# Patient Record
Sex: Male | Born: 1955 | Race: White | Hispanic: No | Smoking: Never smoker
Health system: Southern US, Community
[De-identification: ages and names within clinical notes are randomized; demographics above are authoritative.]

## PROBLEM LIST (undated history)

## (undated) DIAGNOSIS — E785 Hyperlipidemia, unspecified: Secondary | ICD-10-CM

## (undated) DIAGNOSIS — M1990 Unspecified osteoarthritis, unspecified site: Secondary | ICD-10-CM

## (undated) DIAGNOSIS — E119 Type 2 diabetes mellitus without complications: Secondary | ICD-10-CM

## (undated) DIAGNOSIS — K219 Gastro-esophageal reflux disease without esophagitis: Secondary | ICD-10-CM

## (undated) DIAGNOSIS — C801 Malignant (primary) neoplasm, unspecified: Secondary | ICD-10-CM

## (undated) HISTORY — PX: NOSE SURGERY: SHX723

## (undated) HISTORY — DX: Hyperlipidemia, unspecified: E78.5

## (undated) HISTORY — PX: TONSILLECTOMY AND ADENOIDECTOMY: SUR1326

## (undated) HISTORY — DX: Type 2 diabetes mellitus without complications: E11.9

## (undated) HISTORY — PX: COLONOSCOPY: SHX174

---

## 1999-03-31 ENCOUNTER — Ambulatory Visit (HOSPITAL_COMMUNITY): Admission: RE | Admit: 1999-03-31 | Discharge: 1999-03-31 | Payer: Self-pay | Admitting: Neurosurgery

## 1999-03-31 ENCOUNTER — Encounter: Payer: Self-pay | Admitting: Neurosurgery

## 2000-05-30 ENCOUNTER — Ambulatory Visit (HOSPITAL_BASED_OUTPATIENT_CLINIC_OR_DEPARTMENT_OTHER): Admission: RE | Admit: 2000-05-30 | Discharge: 2000-05-30 | Payer: Self-pay | Admitting: Otolaryngology

## 2000-07-26 ENCOUNTER — Encounter (INDEPENDENT_AMBULATORY_CARE_PROVIDER_SITE_OTHER): Payer: Self-pay | Admitting: *Deleted

## 2000-07-26 ENCOUNTER — Ambulatory Visit (HOSPITAL_COMMUNITY): Admission: RE | Admit: 2000-07-26 | Discharge: 2000-07-27 | Payer: Self-pay | Admitting: Otolaryngology

## 2003-05-22 ENCOUNTER — Emergency Department (HOSPITAL_COMMUNITY): Admission: EM | Admit: 2003-05-22 | Discharge: 2003-05-22 | Payer: Self-pay | Admitting: Family Medicine

## 2006-12-12 ENCOUNTER — Ambulatory Visit: Payer: Self-pay | Admitting: Psychiatry

## 2006-12-18 ENCOUNTER — Ambulatory Visit: Payer: Self-pay | Admitting: Psychiatry

## 2010-04-26 LAB — HM COLONOSCOPY

## 2010-07-02 NOTE — H&P (Signed)
[place]. [facility]  Patient:    Raymond Michael, Raymond Michael                         MRN: [id number] Adm. Date:  07/26/00 Attending:  Keturah Barre, M.D. CC:         Megan Mans., M.D.  [address] [place], [place] [address]                         History and Physical  HISTORY:  This patient is a 55 year old male who enters with snoring and sleep apnea problem.  He has also had associated reflux esophagitis.  He has gained a considerable amount of weight recently.  He is 6 ft, 1 in and weighs 255 pounds.  He is a former [employer] and has a very full neck and has had considerable history of traumatized nose and also has had previous nasal and septal surgery done approximately ten years ago.  He has considerable obstruction from this time.  With the septal surgery it is still deviated.  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  Nonetheless, he has minimal airway problems because of previous scar tissue and also a high ethmoid septal deviation limiting both airways.  He has used decongestants, nasal sprays, Breath Ease strips and has had very little results with his nasal breathing.  With this elevation of his weight, he is snoring more, has enlarged tonsils, small nasopharynx and oropharynx and enters after having a sleep study with a respiratory disturbance index of 10 with a lowest 02 saturation of 84 awakening 26 times and spending 23% of his time in a deep REM sleep.  However, he is considered to have reduced REM percentage.  He has a normal cardiac rhythm and with this he has become quite sleep deprived and is not able to function as well as he would like and is essentially falling asleep at certain times during reading and sitting.  His situation with C-PAP was essentially out of the question with no nasal airway to speak of.  His other choices are weight loss but very difficult  with sleep deprivation and he has been on Nexium 40 mg for his reflux esophagitis which is associated with this problem.  We therefore, decided to do a septal reconstruction and turbinate reduction with lysis of adhesions, a palatopharyngoplasty and tonsillectomy under general endotracheal anesthesia.  PAST HISTORY:  Remarkable for the fact that his only medications are Nexium 40 mg.  He is not a smoker, takes occasional alcoholic drink.  He has had some nasal bleeding in the past and has the hiatal hernia with the reflux esophagitis.  He also has a large anterior bridge from his previous football injuries.  Otherwise, he is in good medical health.  He has no allergies to medications, takes no other medications and has not had any previous surgery except the above mentioned.  PHYSICAL EXAMINATION:  VITAL SIGNS:  Blood pressure of 142/68, pulse 69.  He weighs 260 at this time. He is 61".  His hemoglobin and hematocrit 15.2 and 43.9, WBC 5.8.  SAO2 97% when awake.  EKG normal sinus rhythm and chest x-ray appears to be clear.  HEENT:  Nasal evaluation shows his septum is deviated to the right with synechia with scar tissue where the turbinates and septum are scarred together.  Essentially he has had no  nasal airway on the right.  The left side shows a high ethmoid deviation which limits his airway on that side.  The oral cavity shows quite large tonsils and a very small nasopharynx and oropharynx situation.  His larynx is clear.  True cords, false cords, epiglottis, base of tongue are free of any ulceration and true cords move well.  Gag reflex is normal.  NECK:  Full.  No thyromegaly, cervical adenopathy or masses.  CHEST:  Clear, no rales, rhonchi or wheezes.  Increased AP diameter.  The EKG mentioned no opening snaps, murmurs or gallops on cardiac examination.  ABDOMEN:  Mildly obese.  EXTREMITIES: Unremarkable.  NEUROLOGIC:  Oriented x 3. Cranial nerves intact.  INITIAL  DIAGNOSES: 1. Sleep apnea with septal deviation. 2. History of nasal trauma. 3. 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  Also with tonsillar hypertrophy and palatal redundance. 4. Reflux esophagitis. DD:  07/26/00 TD:  07/26/00 Job: [id number]

## 2010-07-02 NOTE — Op Note (Signed)
[place]. [facility]  Patient:    Raymond Michael, Raymond Michael                         MRN: [id number] Adm. Date:  07/26/00 Attending:  Keturah Barre, M.D. CC:         Julieanne Manson, Montez Hageman., M.D. 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  POSTOPERATIVE DIAGNOSIS:  Septal deviation, turbinate hypertrophy with nasal scarring and septal synechiae or scarring with tonsilar hypertrophy with redundant uvula and palate with sleep apnea.  PROCEDURE:  Septal reconstruction, turbinate reduction with lysis of adhesions synechiae with a tonsilectomy and palatopharyngoplasty and uvulopalatopharyngoplasty.  SURGEON:  Keturah Barre, M.D.  ANESTHESIA:  General endotracheal anesthesia with Dr. Krista Blue.  DESCRIPTION OF PROCEDURE:  The patient was placed in the supine position position.  Under general endotracheal anesthesia, the patient was prepped and draped using Hibiclens x 3 and the usual head drape.  The nose was anesthetized with topical cocaine and 1% Xylocaine with epinephrine.  The inferior turbinates were aggressively outfractured and then the mucous membranes synechiae were separated slowly, but carefully on the right side in an effort to minimize trauma to these delicate membranes.  This was quite extensive and once we were able to do this, we were able to save the membrane on each side and then reduce the inferior turbinates and middle turbinates on that right side to gain more space, but not crowding the ethmoid sinus.  With this, we then made a hemitransfixion incision on the right side carried up toward the quadrilateral cartilage and ethmoid septum and the strips of the quadrilateral cartilage were taken for deviation more on  the floor of the nose on the right, and then the vomerine septal deviation was pushing still over against the side of the nose.  This was corrected as well as the ethmoid septal deviation by using the open and close Hansen-Middletons and Takahashi forceps.  Once the right septal deviation was corrected and then the left septal deviation was corrected which was high ethmoid, we were able to establish the septum in the midline.  At this point, we gained considerable amount of space.  Closure was with 5-0 plain catgut and through and through septal suture using 4-0 plain x 2 as a blanket stitch and then Maricelle pack was placed to minimize swelling and guarantee hemostasis.  At this point, attention was carried to the oral cavity.  The tonsilar gag was placed.  The oropharynx and nasopharynx were very shallow and narrow.  Tonsils were removed using sharp, blunt, and snare dissection with hemostasis and dissection with Bovie electrocoagulation.  The palate and uvula were trimmed and then the mucous membrane anteriorly and posteriorly in the uvula was brought into approximation using 5-0 plain catgut.  All hemostasis was established in this area.  Nasopharynx was suctioned.  The oral cavity was suctioned dry as was the stomach suctioned.  Attention was then carried back in the nose.  We could see the nasopharynx clearly, the Mericelle packs were removed.  A small Telfa was placed on the right side and the anesthesia trumpets were placed bilaterally.  The patient was  awakened, tolerated the procedure well, and is doing well postoperatively.  FOLLOW-UP:  Will be in the 23-hour observation, pulse oximetry, and then in one week, three weeks, six weeks, six months, and a year. DD:  07/26/00 TD:  07/26/00 Job: [id number]

## 2011-09-22 ENCOUNTER — Ambulatory Visit: Payer: Self-pay | Admitting: Family Medicine

## 2013-09-13 LAB — TSH: TSH: 3.27 u[IU]/mL (ref <=5.90)

## 2013-09-13 LAB — BASIC METABOLIC PANEL
BUN: 10 mg/dL (ref 4–21)
Glucose: 145 mg/dL
Potassium: 4.9 mmol/L (ref 3.4–5.3)
Sodium: 140 mmol/L (ref 137–147)

## 2013-09-13 LAB — CBC AND DIFFERENTIAL
HCT: 48 % (ref 41–53)
Hemoglobin: 16.5 g/dL (ref 13.5–17.5)
Neutrophils Absolute: 3 /uL
Platelets: 208 10*3/uL (ref 150–399)
WBC: 5.2 10^3/mL

## 2014-01-02 DIAGNOSIS — C44311 Basal cell carcinoma of skin of nose: Secondary | ICD-10-CM | POA: Insufficient documentation

## 2014-01-13 LAB — HEMOGLOBIN A1C: Hgb A1c MFr Bld: 7 % — AB (ref 4.0–6.0)

## 2014-02-12 LAB — HEPATIC FUNCTION PANEL
ALT: 58 U/L — AB (ref 10–40)
AST: 48 U/L — AB (ref 14–40)
Alkaline Phosphatase: 56 U/L (ref 25–125)

## 2014-02-12 LAB — LIPID PANEL
Cholesterol: 139 mg/dL (ref 0–200)
HDL: 43 mg/dL (ref 35–70)
LDL Cholesterol: 72 mg/dL
LDl/HDL Ratio: 1.7
Triglycerides: 121 mg/dL (ref 40–160)

## 2014-08-20 ENCOUNTER — Other Ambulatory Visit: Payer: Self-pay | Admitting: Family Medicine

## 2014-08-20 MED ORDER — CANAGLIFLOZIN-METFORMIN HCL 150-1000 MG PO TABS: 1.0000 | ORAL_TABLET | Freq: Two times a day (BID) | ORAL | Status: DC

## 2014-08-20 NOTE — Telephone Encounter (Signed)
Pt contacted office for refill request on the following medications:  Invokamet 150-1000mg .  [facility].  VO#[phone]/HT

## 2014-09-02 DIAGNOSIS — E66812 Obesity, class 2: Secondary | ICD-10-CM | POA: Insufficient documentation

## 2014-09-02 DIAGNOSIS — G4733 Obstructive sleep apnea (adult) (pediatric): Secondary | ICD-10-CM | POA: Insufficient documentation

## 2014-09-02 DIAGNOSIS — E1140 Type 2 diabetes mellitus with diabetic neuropathy, unspecified: Secondary | ICD-10-CM | POA: Insufficient documentation

## 2014-09-02 DIAGNOSIS — E669 Obesity, unspecified: Secondary | ICD-10-CM | POA: Insufficient documentation

## 2014-09-02 DIAGNOSIS — E119 Type 2 diabetes mellitus without complications: Secondary | ICD-10-CM | POA: Insufficient documentation

## 2014-09-02 DIAGNOSIS — E785 Hyperlipidemia, unspecified: Secondary | ICD-10-CM | POA: Insufficient documentation

## 2014-09-03 ENCOUNTER — Ambulatory Visit: Payer: Self-pay | Admitting: Family Medicine

## 2014-09-22 ENCOUNTER — Ambulatory Visit: Payer: Self-pay | Admitting: Family Medicine

## 2014-09-26 ENCOUNTER — Other Ambulatory Visit: Payer: Self-pay | Admitting: Family Medicine

## 2014-09-29 ENCOUNTER — Telehealth: Payer: Self-pay | Admitting: Family Medicine

## 2014-10-27 ENCOUNTER — Other Ambulatory Visit: Payer: Self-pay | Admitting: Family Medicine

## 2014-10-27 MED ORDER — CANAGLIFLOZIN-METFORMIN HCL 150-1000 MG PO TABS: 1.0000 | ORAL_TABLET | Freq: Two times a day (BID) | ORAL | Status: DC

## 2014-10-27 NOTE — Telephone Encounter (Signed)
Pt contacted office for refill request on the following medications: INVOKAMET [phone] MG [facility]. Pt stated he has enough for about 3 or 4 days.  Thanks TNP

## 2014-10-27 NOTE — Telephone Encounter (Signed)
Patient is overdue for an OV. Patient's LOV was 01/2014. Last refill of medication was 09/29/2014. Ok to send in another refill? Please advise. Thanks!

## 2014-10-27 NOTE — Telephone Encounter (Signed)
Pt advised that he needs appt due to his last one was in December 2015. appt made, RX refilled-aa

## 2014-10-28 ENCOUNTER — Ambulatory Visit (INDEPENDENT_AMBULATORY_CARE_PROVIDER_SITE_OTHER): Payer: BLUE CROSS/BLUE SHIELD | Admitting: Family Medicine

## 2014-10-28 ENCOUNTER — Encounter: Payer: Self-pay | Admitting: Family Medicine

## 2014-10-28 VITALS — BP 140/60 | HR 72 | Temp 97.6°F | Resp 16 | Wt 286.0 lb

## 2014-10-28 DIAGNOSIS — E785 Hyperlipidemia, unspecified: Secondary | ICD-10-CM

## 2014-10-28 DIAGNOSIS — Z23 Encounter for immunization: Secondary | ICD-10-CM

## 2014-10-28 DIAGNOSIS — E119 Type 2 diabetes mellitus without complications: Secondary | ICD-10-CM

## 2014-10-28 LAB — POCT GLYCOSYLATED HEMOGLOBIN (HGB A1C): Hemoglobin A1C: 7.7

## 2014-10-28 NOTE — Progress Notes (Signed)
Patient ID: Raymond Michael, male   DOB: 30-Mar-1955, 59 y.o.   MRN: [id number]    Subjective:  HPI  Diabetes Mellitus Type II, Follow-up:   Lab Results  Component Value Date   HGBA1C 7.0* 01/13/2014    Last seen for diabetes 1 years ago.  Management since then includes none. He reports good compliance with treatment. He is not having side effects.  Current symptoms include none. Home blood sugar records: he does not check it often but he thinks it runs in the 160's  Episodes of hypoglycemia? no   Current Insulin Regimen: n/a Most Recent Eye Exam: 1 year ago Current exercise: walking twice a week.   Pertinent Labs:    Component Value Date/Time   CHOL 139 02/12/2014   TRIG 121 02/12/2014    Wt Readings from Last 3 Encounters:  10/28/14 286 lb (129.729 kg)  02/12/14 289 lb (131.09 kg)    ------------------------------------------------------------------------    Lipid/Cholesterol, Follow-up:   Last seen for this1 years ago.  Management changes since that visit include none. . Last Lipid Panel:    Component Value Date/Time   CHOL 139 02/12/2014   TRIG 121 02/12/2014   HDL 43 02/12/2014   LDLCALC 72 02/12/2014    Risk factors for vascular disease include diabetes mellitus and hypercholesterolemia  He reports poor compliance with treatment. He is not having side effects.  Current symptoms include none  Current exercise: none  Wt Readings from Last 3 Encounters:  10/28/14 286 lb (129.729 kg)  02/12/14 289 lb (131.09 kg)   Pt is not taking his simvastatin.  -------------------------------------------------------------------  Pt would like to discuss ED.     Prior to Admission medications   Medication Sig Start Date End Date Taking? Authorizing Provider  Canagliflozin-Metformin HCl (INVOKAMET) [phone] MG TABS Take 1 tablet by mouth 2 (two) times daily. 10/27/14  Yes Purl Claytor Maceo Pro., MD  HYDROcodone-acetaminophen [facility]) 10-325 MG per tablet Take  by mouth. 02/12/14  Yes Historical Provider, MD  INVOKAMET [phone] MG TABS TAKE 1 TABLET BY MOUTH 2 TIMES DAILY. 09/29/14  Yes Kandis Henry Maceo Pro., MD  naproxen (NAPROSYN) 500 MG tablet Take by mouth. 02/12/14  Yes Historical Provider, MD  simvastatin (ZOCOR) 10 MG tablet Take by mouth. 01/13/14  Yes Historical Provider, MD    Patient Active Problem List   Diagnosis Date Noted  . Diabetes 09/02/2014  . HLD (hyperlipidemia) 09/02/2014  . Adiposity 09/02/2014  . Obstructive apnea 09/02/2014  . Basal cell carcinoma of nose 01/02/2014    History reviewed. No pertinent past medical history.  Social History   Social History  . Marital Status: Divorced    Spouse Name: N/A  . Number of Children: N/A  . Years of Education: N/A   Occupational History  . Not on file.   Social History Main Topics  . Smoking status: Never Smoker   . Smokeless tobacco: Not on file  . Alcohol Use: Yes     Comment: 1-2 drinks per week  . Drug Use: No  . Sexual Activity: Not on file   Other Topics Concern  . Not on file   Social History Narrative    No Known Allergies  Review of Systems  Constitutional: Negative.   HENT: Negative.   Eyes: Negative.   Respiratory: Negative.   Cardiovascular: Negative.   Gastrointestinal: Negative.   Genitourinary: Negative.        ED  Musculoskeletal: Negative.   Skin: Negative.   Neurological: Negative.  Endo/Heme/Allergies: Negative.   Psychiatric/Behavioral: Negative.     Immunization History  Administered Date(s) Administered  . Tdap 03/25/2010   Objective:  BP 140/60 mmHg  Pulse 72  Temp(Src) 97.6 F (36.4 C) (Oral)  Resp 16  Wt 286 lb (129.729 kg)  Physical Exam  Constitutional: He is oriented to person, place, and time and well-developed, well-nourished, and in no distress.  HENT:  Head: Normocephalic and atraumatic.  Right Ear: External ear normal.  Left Ear: External ear normal.  Nose: Nose normal.  Eyes: Conjunctivae are  normal.  Neck: Neck supple.  Cardiovascular: Normal rate, regular rhythm and normal heart sounds.   Pulmonary/Chest: Effort normal and breath sounds normal.  Abdominal: Soft.  Neurological: He is alert and oriented to person, place, and time.  Skin: Skin is warm and dry.  Psychiatric: Mood, memory, affect and judgment normal.    Lab Results  Component Value Date   WBC 5.2 09/13/2013   HGB 16.5 09/13/2013   HCT 48 09/13/2013   PLT 208 09/13/2013   CHOL 139 02/12/2014   TRIG 121 02/12/2014   HDL 43 02/12/2014   LDLCALC 72 02/12/2014   TSH 3.27 09/13/2013   HGBA1C 7.0* 01/13/2014    CMP     Component Value Date/Time   NA 140 09/13/2013   K 4.9 09/13/2013   BUN 10 09/13/2013   AST 48* 02/12/2014   ALT 58* 02/12/2014   ALKPHOS 56 02/12/2014    Assessment and Plan :  1. Type 2 diabetes mellitus without complication Pt ready to work on habits and lose weight.Will need ACE/ARB if BP not down on next OV 4 months.  - POCT HgB A1C--7.6 today. - Lipid Panel With LDL/HDL Ratio - Renal function panel  2. HLD (hyperlipidemia) Will probably need statin.   3. Need for influenza vaccination  4.Morbid Obesity More tan 50% of time spent discussing issues regarding weight and DM.  - Flu Vaccine QUAD 36+ mos IM   [facility] 10/28/2014 8:52 AM

## 2014-11-23 ENCOUNTER — Other Ambulatory Visit: Payer: Self-pay | Admitting: Family Medicine

## 2014-12-30 ENCOUNTER — Other Ambulatory Visit: Payer: Self-pay | Admitting: Family Medicine

## 2015-03-02 ENCOUNTER — Ambulatory Visit (INDEPENDENT_AMBULATORY_CARE_PROVIDER_SITE_OTHER): Payer: BLUE CROSS/BLUE SHIELD | Admitting: Family Medicine

## 2015-03-02 VITALS — BP 102/60 | HR 64 | Temp 98.0°F | Resp 14 | Wt 287.0 lb

## 2015-03-02 DIAGNOSIS — E119 Type 2 diabetes mellitus without complications: Secondary | ICD-10-CM | POA: Diagnosis not present

## 2015-03-02 DIAGNOSIS — E669 Obesity, unspecified: Secondary | ICD-10-CM | POA: Diagnosis not present

## 2015-03-02 DIAGNOSIS — R079 Chest pain, unspecified: Secondary | ICD-10-CM | POA: Diagnosis not present

## 2015-03-02 DIAGNOSIS — E785 Hyperlipidemia, unspecified: Secondary | ICD-10-CM | POA: Diagnosis not present

## 2015-03-02 DIAGNOSIS — M79645 Pain in left finger(s): Secondary | ICD-10-CM | POA: Diagnosis not present

## 2015-03-02 NOTE — Progress Notes (Signed)
Patient ID: Raymond Michael, male   DOB: Feb 21, 1955, 60 y.o.   MRN: [id number]   Raymond Michael  MRN: [id number] DOB: 16-Jan-1956  Subjective:  HPI  1. Type 2 diabetes mellitus without complication, without long-term current use of insulin [facility]) The patient is a 60 year old male who presents for follow up of his diabetes.  His last visit was on 10/28/14.  No management changes were made at that time and his A1C was 7.6.  The patient has been checking his glucose at home and his readings have ranged from 130-160.  2. Hyperlipidemia The patient is also here to follow up on his elevated cholesterol.  Due to a misunderstanding the patient had not followed up on labs after being started on Simvastatin.  The patient was under the impression that was just going to be a temporary medication.  He needs follow up on that today.  He is currently not on any medications.  3. Chest pain, unspecified chest pain type The patient reports today that he has been having some chest pain for a few days.  He describes it as being on his left side and he states he feels it is muscular.  He says that it is worse with certain movement.  He does not recall any injury or event that may have caused any trauma to the chest.  He denies any SOB, sweats, headaches, dizziness or DOE.  When sitting he does not experience any pain but only when he moves in a certain position.  He states he has been taking some Naproxen for his back pain, which has helped the back but not the chest.   Patient Active Problem List   Diagnosis Date Noted  . Diabetes ([place]) 09/02/2014  . HLD (hyperlipidemia) 09/02/2014  . Adiposity 09/02/2014  . Obstructive apnea 09/02/2014  . Basal cell carcinoma of nose 01/02/2014    No past medical history on file.  Social History   Social History  . Marital Status: Divorced    Spouse Name: N/A  . Number of Children: N/A  . Years of Education: N/A   Occupational History  . Not on file.   Social History  Main Topics  . Smoking status: Never Smoker   . Smokeless tobacco: Not on file  . Alcohol Use: Yes     Comment: 1-2 drinks per week  . Drug Use: No  . Sexual Activity: Not on file   Other Topics Concern  . Not on file   Social History Narrative    Outpatient Prescriptions Prior to Visit  Medication Sig Dispense Refill  . INVOKAMET [phone] MG TABS TAKE 1 TABLET BY MOUTH 2 (TWO) TIMES DAILY. 60 tablet 6  . simvastatin (ZOCOR) 10 MG tablet Take by mouth. Reported on 03/02/2015     No facility-administered medications prior to visit.    No Known Allergies  Review of Systems  Constitutional: Negative for fever, chills, malaise/fatigue and diaphoresis.  Eyes: Negative.   Respiratory: Negative for cough, sputum production, shortness of breath and wheezing.   Cardiovascular: Positive for chest pain. Negative for palpitations, orthopnea and leg swelling.  Gastrointestinal: Negative.   Neurological: Negative for dizziness, weakness and headaches.  Psychiatric/Behavioral: Negative.    Objective:  BP 102/60 mmHg  Pulse 64  Temp(Src) 98 F (36.7 C) (Oral)  Resp 14  Wt 287 lb (130.182 kg)  Physical Exam  Constitutional: He is oriented to person, place, and time and well-developed, well-nourished, and in no distress.  HENT:  Head: Normocephalic and atraumatic.  Right Ear: External ear normal.  Left Ear: External ear normal.  Nose: Nose normal.  Eyes: Conjunctivae are normal.  Neck: Normal range of motion.  Cardiovascular: Normal rate, regular rhythm and normal heart sounds.   Pain is reproduced with movement  Pulmonary/Chest: Effort normal and breath sounds normal.  Abdominal: Soft. Bowel sounds are normal.  Musculoskeletal: Normal range of motion.  Tenderness at the left first MP joint.  Minimal swelling.  Neurological: He is alert and oriented to person, place, and time. Gait normal.  Skin: Skin is warm and dry.  Psychiatric: Mood, memory, affect and judgment normal.     Assessment and Plan :   1. Type 2 diabetes mellitus without complication, without long-term current use of insulin (HCC)  - Hemoglobin A1c  2. Hyperlipidemia  - Lipid Panel With LDL/HDL Ratio - COMPLETE METABOLIC PANEL WITH GFR  3. Chest pain, unspecified chest pain type This absolutely appears to be musculoskeletal chest wall pain. It is reproduced with certain movements. It is not exertional. No associated symptoms.  4. Pain of finger of left hand  - DG Hand Complete Left; Future Offered referral to orthopedic/hand surgeon. 5. Obesity - TSH I have done the exam and reviewed the above chart and it is accurate to the best of my knowledge.    Miguel Aschoff MD [facility] 03/02/2015 8:14 AM

## 2015-03-23 ENCOUNTER — Encounter: Payer: Self-pay | Admitting: Physician Assistant

## 2015-03-23 ENCOUNTER — Ambulatory Visit (INDEPENDENT_AMBULATORY_CARE_PROVIDER_SITE_OTHER): Payer: BLUE CROSS/BLUE SHIELD | Admitting: Physician Assistant

## 2015-03-23 ENCOUNTER — Encounter: Payer: Self-pay | Admitting: Family Medicine

## 2015-03-23 VITALS — BP 130/80 | HR 79 | Temp 98.3°F | Resp 16 | Wt 287.6 lb

## 2015-03-23 DIAGNOSIS — M351 Other overlap syndromes: Secondary | ICD-10-CM | POA: Diagnosis not present

## 2015-03-23 DIAGNOSIS — M2540 Effusion, unspecified joint: Secondary | ICD-10-CM

## 2015-03-23 DIAGNOSIS — M069 Rheumatoid arthritis, unspecified: Secondary | ICD-10-CM

## 2015-03-23 MED ORDER — SIMVASTATIN 10 MG PO TABS: 10.0000 mg | ORAL_TABLET | Freq: Every day | ORAL | Status: DC

## 2015-03-23 MED ORDER — HYDROCODONE-ACETAMINOPHEN 7.5-325 MG PO TABS: 1.0000 | ORAL_TABLET | Freq: Four times a day (QID) | ORAL | Status: DC | PRN

## 2015-03-23 MED ORDER — PREDNISONE 20 MG PO TABS: ORAL_TABLET | ORAL | Status: DC

## 2015-03-23 NOTE — Progress Notes (Addendum)
Patient: Raymond Michael Male    DOB: Sep 10, 1955   60 y.o.   MRN: [id number] Visit Date: 03/23/2015  Today's Provider: Mar Daring, PA-C   Chief Complaint  Patient presents with  . Knee Pain   Subjective:    Knee Pain  The incident occurred more than 1 week ago (3 week ago). There was no injury mechanism. The pain is present in the left knee (also left hand is swollen, patient hurt his index finger about two months ago and has been seen for this but states it hurts and it looks more swollen). The quality of the pain is described as aching, burning, cramping, shooting and stabbing (Left knee swollen). The pain is at a severity of 10/10. The pain is severe. The pain has been constant since onset. Associated symptoms include an inability to bear weight, numbness (left hand) and tingling. Associated symptoms comments: On his hands, he has numbness and tingling.. The symptoms are aggravated by movement and weight bearing. He has tried ice and elevation (Aleve, Ibuprofen, Hydrocodeine) for the symptoms. The treatment provided no relief.   Symptoms started approximately 3 weeks ago. He states that he has had pain onset that has gradually worsened over the last 3 weeks to become almost unbearable. He has had to increase his pain medication from ibuprofen and/or Aleve to hydrocodone. He does have multiple joint swelling including the left knee, left lower extremity , left shoulder, left elbow , and left hand. He states that this has been progressive. There has been no exposure to ticks that he knows but he does golf regularly up until the last 3 weeks. He has never been diagnosed with rheumatoid arthritis and states he cannot recall a true family history but that his mother may have had this.     No Known Allergies Previous Medications   INVOKAMET [phone] MG TABS    TAKE 1 TABLET BY MOUTH 2 (TWO) TIMES DAILY.   SIMVASTATIN (ZOCOR) 10 MG TABLET    Take by mouth. Reported on 03/23/2015     Review of Systems  Constitutional: Negative for fever, chills and fatigue.  HENT: Negative.   Respiratory: Negative.   Cardiovascular: Negative.   Gastrointestinal: Negative.   Musculoskeletal: Positive for myalgias (left side), joint swelling (multiple), arthralgias (multiple), gait problem (hurts to bear weight left leg) and neck stiffness (left side).  Skin: Negative for color change and rash.  Neurological: Positive for tingling, weakness (left grip) and numbness (left hand).    Social History  Substance Use Topics  . Smoking status: Never Smoker   . Smokeless tobacco: Not on file  . Alcohol Use: Yes     Comment: 1-2 drinks per week   Objective:   BP 130/80 mmHg  Pulse 79  Temp(Src) 98.3 F (36.8 C) (Oral)  Resp 16  Wt 287 lb 9.6 oz (130.455 kg)  Physical Exam  Constitutional: He appears well-developed and well-nourished. No distress.  HENT:  Head: Normocephalic and atraumatic.  Neck: Normal range of motion. Neck supple. No JVD present. No tracheal deviation present. No thyromegaly present.  Cardiovascular: Normal rate, regular rhythm and normal heart sounds.  Exam reveals no gallop and no friction rub.   No murmur heard. Pulmonary/Chest: Effort normal and breath sounds normal. No respiratory distress. He has no wheezes. He has no rales.  Musculoskeletal:       Right shoulder: Normal.       Left shoulder: He exhibits decreased range of motion,  tenderness (only with movement) and decreased strength. He exhibits no bony tenderness, no swelling, no effusion, no crepitus, no spasm and normal pulse.       Right elbow: Normal.      Left elbow: Normal.       Right wrist: Normal.       Left wrist: He exhibits decreased range of motion, tenderness and swelling. He exhibits no bony tenderness, no effusion and no deformity.       Right knee: Normal.       Left knee: He exhibits decreased range of motion, swelling and effusion (medial). He exhibits no ecchymosis, no deformity,  no erythema, normal alignment, no LCL laxity, normal patellar mobility, no bony tenderness, normal meniscus and no MCL laxity. Tenderness found. Medial joint line tenderness noted.       Right ankle: Normal.       Left ankle: Normal.       Cervical back: He exhibits decreased range of motion, pain (with motion) and abnormal pulse. He exhibits no tenderness, no bony tenderness, no swelling and no spasm.  Lymphadenopathy:    He has no cervical adenopathy.  Skin: He is not diaphoretic.  Vitals reviewed.       Assessment & Plan:     1. Rheumatoid arthritis flare (HCC)  I do feel that his symptoms are most closely related with a possible rheumatoid arthritis flare. I will be checking labs as below to rule this in or out, or to rule in or out other causes as well. I will go ahead and preemptively treat with prednisone and Norco as below. I will have him return to the office in one week for follow-up to see either me or Dr. Rosanna Randy to see how he is responding to the prednisone. I will also follow up with him pending the results of the labs as below. If rheumatoid factor is positive will refer to rheumatology for further evaluation and possible treatment. He is to call the office if symptoms worsen in the meantime. If not we will see him back in one week. - HYDROcodone-acetaminophen (NORCO) 7.5-325 MG tablet; Take 1 tablet by mouth every 6 (six) hours as needed for moderate pain.  Dispense: 60 tablet; Refill: 0 - predniSONE (DELTASONE) 20 MG tablet; Take 3 tabs PO on day 1&2, 2 tabs PO on day 3&4, 1 tab PO until completed  Dispense: 20 tablet; Refill: 0  2. Joint swelling See above medical treatment plan. - ANA w/Reflex if Positive - C-reactive protein - Sed Rate (ESR) - Rheumatoid Factor - CBC With Differential - Lyme Ab/Western Blot Reflex  *Addendum: With receiving the results of the labs above it was noted that he had a positive ANA and elevated CRP (264). 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 We'll continue with referral to rheumatology for further evaluation and treatment.       Mar Daring, PA-C  [facility]

## 2015-03-23 NOTE — Patient Instructions (Signed)
Rheumatoid Arthritis  Rheumatoid arthritis is a long-term (chronic) inflammatory disease that causes pain, swelling, and stiffness of the joints. It can affect the entire body, including the eyes and lungs. The effects of rheumatoid arthritis vary widely among those with the condition.  CAUSES  The cause of rheumatoid arthritis is not known. It tends to run in families and is more common in women. Certain cells of the body's natural defense system (immune system) do not work properly and begin to attack healthy joints. It primarily involves the connective tissue that lines the joints (synovial membrane). This can cause damage to the joint.  SYMPTOMS  · Pain, stiffness, swelling, and decreased motion of many joints, especially in the hands and feet.  · Stiffness that is worse in the morning. It may last 1-2 hours or longer.  · Numbness and tingling in the hands.  · Fatigue.  · Loss of appetite.  · Weight loss.  · Low-grade fever.  · Dry eyes and mouth.  · Firm lumps (rheumatoid nodules) that grow beneath the skin in areas such as the elbows and hands.  DIAGNOSIS  Diagnosis is based on the symptoms described, an exam, and blood tests. Sometimes, X-rays are helpful.  TREATMENT  The goals of treatment are to relieve pain, reduce inflammation, and to slow down or stop joint damage and disability. Methods vary and may include:  · Maintaining a balance of rest, exercise, and proper nutrition.  · Your health care provider may adjust your medicines every 3 months until treatment goals are reached. Common medicines include:    Pain relievers (analgesics).    Corticosteroids and nonsteroidal anti-inflammatory drugs (NSAIDs) to reduce inflammation.    Disease-modifying antirheumatic drugs (DMARDs) to try to slow the course of the disease.    Biologic response modifiers to reduce inflammation and damage.  · Physical therapy and occupational therapy.  · Surgery for patients with severe joint damage. Joint replacement or fusing of  joints may be needed.  · Routine monitoring and ongoing care, such as office visits, blood and urine tests, and X-rays.  Your health care provider will work with you to identify the best treatment option for you, based on an assessment of the overall disease activity in your body.  HOME CARE INSTRUCTIONS  · Remain physically active and reduce activity when the disease gets worse.  · Eat a well-balanced diet.  · Put heat on affected joints when you wake up and before activities. Keep the heat on the affected joint for as long as directed by your health care provider.  · Put ice on affected joints following activities or exercising.    Put ice in a plastic bag.    Place a towel between your skin and the bag.    Leave the ice on for 15-20 minutes, 3-4 times per day, or as directed by your health care provider.  · Take medicines and supplements only as directed by your health care provider.  · Use splints as directed by your health care provider. Splints help maintain joint position and function.  · Do not sleep with pillows under your knees. This may lead to spasms.  · Participate in a self-management program to keep current with the latest treatment and coping skills.  SEEK IMMEDIATE MEDICAL CARE IF:  · You have fainting episodes.  · You have periods of extreme weakness.  · You rapidly develop a hot, painful joint that is more severe than usual joint aches.  · You have chills.  ·   You have a fever.  FOR MORE INFORMATION  · American College of Rheumatology: www.rheumatology.org  · Arthritis Foundation: www.arthritis.org     This information is not intended to replace advice given to you by your health care provider. Make sure you discuss any questions you have with your health care provider.     Document Released: 01/29/2000 Document Revised: 02/21/2014 Document Reviewed: 03/09/2011  Elsevier Interactive Patient Education ©2016 [employer]

## 2015-03-25 LAB — ANA W/REFLEX IF POSITIVE
Anti JO-1: <0.2 AI (ref 0.0–0.9)
Anti Nuclear Antibody(ANA): POSITIVE — AB
Centromere Ab Screen: <0.2 AI (ref 0.0–0.9)
Chromatin Ab SerPl-aCnc: <0.2 AI (ref 0.0–0.9)
ENA RNP Ab: 1.4 AI — ABNORMAL HIGH (ref 0.0–0.9)
ENA SM Ab Ser-aCnc: <0.2 AI (ref 0.0–0.9)
ENA SSA (RO) Ab: <0.2 AI (ref 0.0–0.9)
ENA SSB (LA) Ab: <0.2 AI (ref 0.0–0.9)
Scleroderma SCL-70: <0.2 AI (ref 0.0–0.9)
dsDNA Ab: <1 IU/mL (ref 0–9)

## 2015-03-25 LAB — CBC WITH DIFFERENTIAL
Basophils Absolute: 0 10*3/uL (ref 0.0–0.2)
Basos: 0 %
EOS (ABSOLUTE): 0.2 10*3/uL (ref 0.0–0.4)
Eos: 2 %
Hematocrit: 46.2 % (ref 37.5–51.0)
Hemoglobin: 15.8 g/dL (ref 12.6–17.7)
Immature Grans (Abs): 0 10*3/uL (ref 0.0–0.1)
Immature Granulocytes: 0 %
Lymphocytes Absolute: 1.4 10*3/uL (ref 0.7–3.1)
Lymphs: 19 %
MCH: 30.6 pg (ref 26.6–33.0)
MCHC: 34.2 g/dL (ref 31.5–35.7)
MCV: 90 fL (ref 79–97)
Monocytes Absolute: 0.8 10*3/uL (ref 0.1–0.9)
Monocytes: 11 %
Neutrophils Absolute: 5.3 10*3/uL (ref 1.4–7.0)
Neutrophils: 68 %
RBC: 5.16 x10E6/uL (ref 4.14–5.80)
RDW: 14.6 % (ref 12.3–15.4)
WBC: 7.7 10*3/uL (ref 3.4–10.8)

## 2015-03-25 LAB — LYME AB/WESTERN BLOT REFLEX
LYME DISEASE AB, QUANT, IGM: <0.8 index (ref 0.00–0.79)
Lyme IgG/IgM Ab: <0.91 {ISR} (ref 0.00–0.90)

## 2015-03-25 LAB — SEDIMENTATION RATE: Sed Rate: 11 mm/hr (ref 0–30)

## 2015-03-25 LAB — RHEUMATOID FACTOR: Rhuematoid fact SerPl-aCnc: 11.7 IU/mL (ref 0.0–13.9)

## 2015-03-25 LAB — C-REACTIVE PROTEIN: CRP: 264.1 mg/L — ABNORMAL HIGH (ref 0.0–4.9)

## 2015-03-25 NOTE — Addendum Note (Signed)
Addended by: Mar Daring on: 03/25/2015 11:07 AM   Modules accepted: Orders, SmartSet

## 2015-03-26 ENCOUNTER — Encounter: Payer: Self-pay | Admitting: Physician Assistant

## 2015-03-30 ENCOUNTER — Encounter: Payer: Self-pay | Admitting: Physician Assistant

## 2015-03-30 ENCOUNTER — Ambulatory Visit (INDEPENDENT_AMBULATORY_CARE_PROVIDER_SITE_OTHER): Payer: BLUE CROSS/BLUE SHIELD | Admitting: Physician Assistant

## 2015-03-30 VITALS — BP 130/68 | HR 84 | Temp 98.3°F | Resp 16 | Wt 281.2 lb

## 2015-03-30 DIAGNOSIS — M2550 Pain in unspecified joint: Secondary | ICD-10-CM | POA: Diagnosis not present

## 2015-03-30 DIAGNOSIS — M069 Rheumatoid arthritis, unspecified: Secondary | ICD-10-CM | POA: Diagnosis not present

## 2015-03-30 DIAGNOSIS — M351 Other overlap syndromes: Secondary | ICD-10-CM

## 2015-03-30 MED ORDER — PREDNISONE 20 MG PO TABS: ORAL_TABLET | ORAL | Status: DC

## 2015-03-30 MED ORDER — HYDROCODONE-ACETAMINOPHEN 10-325 MG PO TABS: 1.0000 | ORAL_TABLET | Freq: Four times a day (QID) | ORAL | Status: DC | PRN

## 2015-03-30 NOTE — Progress Notes (Signed)
Patient: Raymond Michael Male    DOB: 24-May-1955   60 y.o.   MRN: [id number] Visit Date: 03/30/2015  Today's Provider: Mar Daring, PA-C   Chief Complaint  Patient presents with  . Follow-up    arthritis   Subjective:    HPI  Mr. Raymond Michael is here for his 1 week follow-up. On the last visit he was put on Prednisone and was referred for further evaluation and treatment to rheumatology. His Labs results showed positive ANA and elevated CRP (264). The ANA was reflex and showed an elevated RNP antibody indicating possible mixed connective tissue disease. He is scheduled to see Rheumatology on Thursday 04/02/15. He states he did start feeling some relief but pain came back.  Had a good day yesterday but left shoulder and knee hurt today.     No Known Allergies Previous Medications   HYDROCODONE-ACETAMINOPHEN (NORCO) 7.5-325 MG TABLET    Take 1 tablet by mouth every 6 (six) hours as needed for moderate pain.   INVOKAMET [phone] MG TABS    TAKE 1 TABLET BY MOUTH 2 (TWO) TIMES DAILY.   PREDNISONE (DELTASONE) 20 MG TABLET    Take 3 tabs PO on day 1&2, 2 tabs PO on day 3&4, 1 tab PO until completed   SIMVASTATIN (ZOCOR) 10 MG TABLET    Take 1 tablet (10 mg total) by mouth daily.    Review of Systems  Constitutional: Positive for fatigue.  HENT: Negative.   Respiratory: Negative.   Cardiovascular: Negative.   Gastrointestinal: Negative.   Genitourinary: Negative.   Musculoskeletal: Positive for myalgias and arthralgias.  Skin: Negative.     Social History  Substance Use Topics  . Smoking status: Never Smoker   . Smokeless tobacco: Not on file  . Alcohol Use: Yes     Comment: 1-2 drinks per week   Objective:   BP 130/68 mmHg  Pulse 84  Temp(Src) 98.3 F (36.8 C) (Oral)  Resp 16  Wt 281 lb 3.2 oz (127.551 kg)  Physical Exam  Constitutional: He appears well-developed and well-nourished. No distress.  HENT:  Head: Normocephalic and atraumatic.  Neck: Normal  range of motion. Neck supple. No JVD present. No tracheal deviation present. No thyromegaly present.  Cardiovascular: Normal rate, regular rhythm and normal heart sounds.  Exam reveals no gallop and no friction rub.   No murmur heard. Pulmonary/Chest: Effort normal and breath sounds normal. No respiratory distress. He has no wheezes. He has no rales.  Musculoskeletal:       Right shoulder: Normal.       Left shoulder: Normal.       Right knee: Normal.       Left knee: He exhibits decreased range of motion. He exhibits no swelling, no effusion, no erythema, normal alignment, no LCL laxity, normal patellar mobility, no bony tenderness, normal meniscus and no MCL laxity. Tenderness found. Medial joint line and lateral joint line tenderness noted. No MCL, no LCL and no patellar tendon tenderness noted.  Lymphadenopathy:    He has no cervical adenopathy.  Skin: He is not diaphoretic.  Vitals reviewed.       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 Some improvement with initial prednisone but not complete.  Will refill prednisone as below and refill pain medication as below for control. He is to keep his appointment on Thursday 04/02/15 with  rheumatology.  He may call the office following this appointment if needed for follow up. - HYDROcodone-acetaminophen (NORCO) 10-325 MG tablet; Take 1 tablet by mouth every 6 (six) hours as needed.  Dispense: 30 tablet; Refill: 0 - predniSONE (DELTASONE) 20 MG tablet; Take 3 tabs PO on day 1&2, 2 tabs PO on day 3&4, 1 tab PO until completed  Dispense: 20 tablet; Refill: 0       Mar Daring, PA-C  [facility]

## 2015-03-30 NOTE — Patient Instructions (Signed)
Mixed Connective Tissue Disease (MCTD)  Synonyms of Mixed Connective Tissue Disease (MCTD) Connective Tissue Disease  MCTD  General Discussion Mixed connective tissue disease (MTCD) is a rare connective tissue disorder. MCTD is used to describe what may be an overlapping group of connective tissue disorders that cannot be diagnosed in more specific terms. These disorders include systemic lupus erythematosus, polymyositis, and scleroderma. Individuals with MCTD have symptoms of each of these disorders including arthritic, cardiac, pulmonary and skin manifestations; kidney disease; muscle weakness, and dysfunction of the esophagus. The exact cause of mixed connective tissue disease is unknown.  Signs & Symptoms Individuals with mixed connective tissue disease have symptoms that are similar to or directly overlap with those of several different connective tissue diseases. These diseases include systemic lupus erythematosus, polymyositis, scleroderma, and rheumatoid arthritis. (For more information on these disorders, see the Related Disorders section of this report.) A condition known as Raynaud's phenomenon may precede the development of additional symptoms of MCTD by months or years. Raynaud's phenomenon is characterized by painfully cold fingers and toes caused by widening (dilation) or narrowing (constriction) of small blood vessels in the hands and feet in response to cold. It occurs in approximately 85 percent of individuals with MCTD.  Pain in multiple joints (polyarthralgia) or inflammation of joints (arthritis) similar to rheumatoid arthritis also occurs in most affected individuals. Muscle weakness due to inflammation (myopathy) of various muscles with or without tenderness is also common. Additional frequent symptoms include fevers of unknown origins and fatigue.  Affected individuals may have abnormal accumulation of fluid in the tissue of the hands that may result in puffiness and swelling  (edema). Increased collagen content in the skin (found in two-thirds of individuals with MCTD) may also be present. Additional frequent skin findings include lupus-like rashes (including reddish brown patches), reddish (erythematous) patches over the knuckles, violet discoloration of the eyelids, non-scarring loss of hair (alopecia), and dilation of small blood vessels around the fingernails (periungual telangiectasia). Dysfunction of the esophagus (hypomotility) may be found in 80 percent of individuals with MCTD, including many who show no other symptoms. The esophagus is the tube that carries food from the mouth to the stomach. Abnormalities in lung function have been found in 80 percent of individuals with MCTD. In some affected individuals, lung involvement may lead to breathing (respiratory) difficulties. In some cases, affected individuals have high blood pressure of blood vessels of the lung (pulmonary hypertension). Heart (cardiac) involvement appears to be less common in MCTD than lung problems. However, two-thirds of children in one pediatric study had evidence of pericarditis, myocarditis and aortic insufficiency. Kidney (renal) disease occurs in 10 percent of individuals with MCTD and is often mild. On occasion, however, it can become a major complication. Neurologic abnormalities are noted in approximately 10 percent of individuals with MCTD. These findings may include a functional disturbance of facial sensation due to involvement of the fifth cranial nerve (trigeminal sensory neuropathy), a cognitive disorder caused by or associated with impaired brain tissue function (organic mental syndrome), blood vessel narrowing (constriction) causing "vascular" headaches, a mild form of meningitis (aseptic meningitis), seizures, blockage of a cerebral vessel (cerebral thrombosis) or hemorrhage, and various sensory disturbances in multiple areas of the body (multiple peripheral neuropathies). Low levels of  circulating red blood cells (anemia) and a reduction in the white blood cell count (leukopenia) occur in 30 to 40 percent of cases. Disease of the lymph nodes (lymphadenopathy), enlargement of the spleen (splenomegaly), enlargement of the liver (hepatomegaly), and intestinal involvement  may also occur in some cases.  Causes The exact cause of mixed connective tissue disease is unknown, although certain findings suggest that a dysfunction of the immune system may be involved, or in some cases it may be genetic.  MCTD appears to be an autoimmune disorder. Autoimmune syndromes are caused by the body's natural defenses (antibodies) against invading organisms that, for unknown reasons, begin to attack healthy tissue. Individuals with MCTD have high amounts of antinuclear antibodies (ANAs) and antibodies that affect ribonucleoprotein (anti-RNP).  Affected Populations Onset of mixed connective tissue disease can occur anytime from early childhood to elderly adulthood, but the average age of onset is 86 years. Approximately 80 percent of individuals are male.  Debate exists in the medical literature as to whether mixed connective tissue disease is a distinct syndrome or a random association of symptoms most commonly found with various connective tissue diseases.  Related Disorders Symptoms of the following disorders can be similar to those of mixed connective tissue disease. Comparisons may be useful for a differential diagnosis: Systemic lupus erythematosus (lupus) is a chronic, inflammatory autoimmune disorder affecting the connective tissue. In autoimmune disorders, the body's own immune system attacks healthy cells and tissues causing inflammation and malfunction of various organ systems. In lupus, the organ systems most often involved include the skin, kidneys, blood and joints. Many different symptoms are associated with lupus, and most affected individuals do not experience all of the symptoms. In some  cases, lupus may be a mild disorder affecting only a few organ systems. In other cases, it may result in serious complications. (For more information on this disorder, choose "lupus" as your search term in the Rare Disease Database.) Scleroderma is a rare connective tissue disorder characterized by abnormally increased production and accumulation of collagen, the body's major structural protein, in skin and other organs of the body. There are systemic and localized forms of scleroderma. Systemic scleroderma is characterized by hardening (induration) and thickening of the skin and abnormal degenerative changes and formation of fibrous tissue (fibrosis) in certain organs of the body including the lungs, heart, kidneys, and gastrointestinal tract. Associated symptoms, which may vary widely from case to case, may include abnormal discoloration of and pain affecting the skin of the hands and feet upon exposure to cold temperatures (Raynaud's phenomenon), abnormal tightness, thickening, "waxiness", and loss of elasticity of the skin; shortness of breath; difficulty swallowing; muscle weakness; joint pain; heart abnormalities including irregular heart beats (palpitations), kidney (renal) abnormalities, and/or other symptoms and findings. In individuals with localized scleroderma, involvement is restricted to the skin, tissue under the skin (subcutaneous tissue), and, in some cases, underlying muscle and bone. Although the exact cause of scleroderma is unknown, some researchers suggest that the disorder represents an abnormal autoimmune response in which the immune system reacts against part of the affected individual's own body. (For more information on this disorder, choose "scleroderma" as your search term in the Rare Disease Database.) Polymyositis is a rare connective tissue disease. The cause is unknown. Polymyositis is characterized by inflammatory degenerative changes in the muscle fibers and the supportive  collagen of connective tissue. The major early symptom of this disorder is muscle weakness, usually in the neck, trunk and shoulders. Eventually, it may become difficult to rise from a sitting position, climb stairs, lift objects and/or reach overhead. Occasionally, joint pain and tenderness also occur. Additional symptoms may also include inflammation of the lungs (interstitial pneumonitis), difficulty breathing, coughing, extreme sensitivity to cold that is most often felt in the  fingers caused by narrowing of blood vessels in the fingers (Raynaud's phenomenon), digestive problems, and/or heart irregularities. (For more information on this disorder, choose "Polymyositis" as your search term in the Rare Disease Database.) Rheumatoid arthritis (RA) is a chronic progressive form of arthritis that eventually leads to the destruction of the bone joints causing pain and physical deformity. Rheumatoid Arthritis is characterized by inflammation of the joints (arthritis) on both sides of the body (symmetrical) leading to swelling, pain, and decreased mobility. The symptoms of this disease usually appear during middle age and it is most common in females. The course of the disease is highly variable and may include episodes of remission.  Diagnosis Mixed connective tissue disease may be diagnosed based upon a thorough clinical evaluation, a detailed patient history, identification of characteristic findings, and specialized tests such as blood tests that reveal abnormally high levels of antinuclear antibodies and antibodies that affect ribonucleoprotein (anti-RNP).  Standard Therapies Treatment The treatment of mixed connective tissue disease is based upon the specific symptoms present in each case. Although no controlled studies have been performed, many of the manifestations of MCTD appear to respond to therapy with corticosteroids such as prednisone. Mild forms of the disease appear to be controlled by  nonsteroidal anti-inflammatory drugs (NSAIDs) or low doses of corticosteroids. When more severe involvement of major organs occurs, larger doses of corticosteroids may be of benefit. This drug treatment also seems to improve skin symptoms and functioning of the esophagus and lungs. In some cases, drugs that suppress the immune system (immunosuppressive drugs) have been used to treat individuals with mixed connective tissue disease.  Investigational Therapies Research into connective tissue diseases is ongoing. The primary goal at this time is to understand the cause. Discovery of the mechanisms that cause this group of diseases would be a major step forward in discovering better treatment or a cure. Information on current clinical trials is posted on the Internet at ConnectRV.com.br. All studies receiving U.[place]. government funding, and some supported by [employer], are posted on [employer]. For information about clinical trials being conducted at the [facility] in [place], [place], contact the [place] Patient Recruitment Office: Tollfree: (800) QZ:3417017 TTY: [phone] Email: [email or web address] For information about clinical trials sponsored by private sources, contact: [email or web address]

## 2015-04-07 ENCOUNTER — Telehealth: Payer: Self-pay

## 2015-04-07 NOTE — Telephone Encounter (Signed)
The PA, from [facility] phone number [phone] saw patient in her office for his pain. Prescribed him HYDROcodone-acetaminophen (NORCO) 10-325 MG tablet. Patient was calling to get the pain medicine but the pharmacy advised the PA that you had already prescribed this medication and they filled your out but not hers and the patient was asking for another prescription. She wanted you to know.  Thanks,  -Joseline

## 2015-04-08 NOTE — Telephone Encounter (Signed)
Ok thanks. I had only given him 5-6 days worth per how the Rx was written so he may have been close to being out and would have needed the refill. I gave him the Rx on 2/13 so he prob would have ran out by 2/19 if he was taking every 6 hours.

## 2015-04-21 ENCOUNTER — Ambulatory Visit (INDEPENDENT_AMBULATORY_CARE_PROVIDER_SITE_OTHER): Payer: BLUE CROSS/BLUE SHIELD | Admitting: Family Medicine

## 2015-04-21 ENCOUNTER — Encounter: Payer: Self-pay | Admitting: Family Medicine

## 2015-04-21 VITALS — BP 120/76 | HR 80 | Temp 97.7°F | Resp 16 | Wt 269.0 lb

## 2015-04-21 DIAGNOSIS — E119 Type 2 diabetes mellitus without complications: Secondary | ICD-10-CM | POA: Diagnosis not present

## 2015-04-21 DIAGNOSIS — R3915 Urgency of urination: Secondary | ICD-10-CM

## 2015-04-21 DIAGNOSIS — E785 Hyperlipidemia, unspecified: Secondary | ICD-10-CM

## 2015-04-21 DIAGNOSIS — R350 Frequency of micturition: Secondary | ICD-10-CM

## 2015-04-21 DIAGNOSIS — N410 Acute prostatitis: Secondary | ICD-10-CM

## 2015-04-21 LAB — POCT URINALYSIS DIPSTICK
Bilirubin, UA: NEGATIVE
Blood, UA: NEGATIVE
Glucose, UA: 1000
Ketones, UA: NEGATIVE
Leukocytes, UA: NEGATIVE
Nitrite, UA: NEGATIVE
Protein, UA: NEGATIVE
Spec Grav, UA: 1.015
Urobilinogen, UA: 0.2
pH, UA: 5

## 2015-04-21 MED ORDER — SULFAMETHOXAZOLE-TRIMETHOPRIM 800-160 MG PO TABS: 1.0000 | ORAL_TABLET | Freq: Two times a day (BID) | ORAL | Status: DC

## 2015-04-21 NOTE — Progress Notes (Signed)
Patient ID: Raymond Michael, male   DOB: 10-22-1955, 60 y.o.   MRN: [id number]    Subjective:  HPI Pt is here today for urinary frequency and urgency. He reports this has been going on for about a week. He denies any new lower back pain, abdominal pain or dysuria. He does not have a history of prostate problems. No fevers but he does have some night sweats. Prior to Admission medications   Medication Sig Start Date End Date Taking? Authorizing Provider  HYDROcodone-acetaminophen (NORCO) 10-325 MG tablet Take 1 tablet by mouth every 6 (six) hours as needed. 03/30/15  Yes Jennifer M Burnette, PA-C  INVOKAMET [phone] MG TABS TAKE 1 TABLET BY MOUTH 2 (TWO) TIMES DAILY. 12/30/14  Yes Kyri Shader Maceo Pro., MD  predniSONE (DELTASONE) 20 MG tablet Take 3 tabs PO on day 1&2, 2 tabs PO on day 3&4, 1 tab PO until completed 03/30/15  Yes Clearnce Sorrel Burnette, PA-C  simvastatin (ZOCOR) 10 MG tablet Take 1 tablet (10 mg total) by mouth daily. 03/23/15  Yes Mar Daring, PA-C    Patient Active Problem List   Diagnosis Date Noted  . Diabetes ([place]) 09/02/2014  . HLD (hyperlipidemia) 09/02/2014  . Adiposity 09/02/2014  . Obstructive apnea 09/02/2014  . Basal cell carcinoma of nose 01/02/2014    History reviewed. No pertinent past medical history.  Social History   Social History  . Marital Status: Divorced    Spouse Name: N/A  . Number of Children: N/A  . Years of Education: N/A   Occupational History  . Not on file.   Social History Main Topics  . Smoking status: Never Smoker   . Smokeless tobacco: Not on file  . Alcohol Use: Yes     Comment: 1-2 drinks per week  . Drug Use: No  . Sexual Activity: Not on file   Other Topics Concern  . Not on file   Social History Narrative    No Known Allergies  Review of Systems  Constitutional: Positive for diaphoresis (at night, not new).  HENT: Negative.   Eyes: Negative.   Respiratory: Negative.   Cardiovascular: Negative.     Gastrointestinal: Negative.   Genitourinary: Positive for urgency and frequency.  Musculoskeletal: Negative.   Skin: Negative.   Neurological: Negative.   Endo/Heme/Allergies: Negative.   Psychiatric/Behavioral: Negative.     Immunization History  Administered Date(s) Administered  . Influenza,inj,Quad PF,36+ Mos 10/28/2014  . Tdap 03/25/2010   Objective:  BP 120/76 mmHg  Pulse 80  Temp(Src) 97.7 F (36.5 C) (Oral)  Resp 16  Wt 269 lb (122.018 kg)  Physical Exam  Constitutional: He is oriented to person, place, and time and well-developed, well-nourished, and in no distress.  Eyes: Conjunctivae are normal. Pupils are equal, round, and reactive to light.  Neck: Normal range of motion. Neck supple.  Cardiovascular: Normal rate, regular rhythm, normal heart sounds and intact distal pulses.   Pulmonary/Chest: Effort normal and breath sounds normal.  Abdominal: Soft. Bowel sounds are normal.  Musculoskeletal: Normal range of motion.  Neurological: He is alert and oriented to person, place, and time. He has normal reflexes. Gait normal. GCS score is 15.  Skin: Skin is warm and dry.  Psychiatric: Mood, memory, affect and judgment normal.    Lab Results  Component Value Date   WBC 7.7 03/23/2015   HGB 16.5 09/13/2013   HCT 46.2 03/23/2015   PLT 208 09/13/2013   CHOL 139 02/12/2014   TRIG 121 02/12/2014  HDL 43 02/12/2014   LDLCALC 72 02/12/2014   TSH 3.27 09/13/2013   HGBA1C 7.7 10/28/2014    CMP     Component Value Date/Time   NA 140 09/13/2013   K 4.9 09/13/2013   BUN 10 09/13/2013   AST 48* 02/12/2014   ALT 58* 02/12/2014   ALKPHOS 56 02/12/2014    Assessment and Plan :  1. Urinary frequency  - POCT urinalysis dipstick - Urine culture - TSH  2. Urinary urgency  - POCT urinalysis dipstick - Urine culture - TSH  3. Type 2 diabetes mellitus without complication, without long-term current use of insulin (HCC) - Hemoglobin A1c - TSH  4. HLD  (hyperlipidemia)  - Lipid Panel With LDL/HDL Ratio - Comprehensive metabolic panel  5. Acute prostatitis  - sulfamethoxazole-trimethoprim (BACTRIM DS,SEPTRA DS) 800-160 MG tablet; Take 1 tablet by mouth 2 (two) times daily.  Dispense: 20 tablet; Refill: 0 - Urine culture - TSH [id number] Arthropathy I have done the exam and reviewed the above chart and it is accurate to the best of my knowledge.  Patient was seen and examined by Dr. Miguel Aschoff, and noted scribed by Webb Laws, [place] MD [facility] 04/21/2015 9:07 AM

## 2015-04-22 LAB — URINE CULTURE

## 2015-04-23 LAB — COMPREHENSIVE METABOLIC PANEL
ALT: 48 IU/L — ABNORMAL HIGH (ref 0–44)
AST: 27 IU/L (ref 0–40)
Albumin/Globulin Ratio: 1.4 (ref 1.1–2.5)
Albumin: 4.2 g/dL (ref 3.5–5.5)
Alkaline Phosphatase: 55 IU/L (ref 39–117)
BUN/Creatinine Ratio: 22 — ABNORMAL HIGH (ref 9–20)
BUN: 21 mg/dL (ref 6–24)
Bilirubin Total: 0.6 mg/dL (ref 0.0–1.2)
CO2: 26 mmol/L (ref 18–29)
Calcium: 9.3 mg/dL (ref 8.7–10.2)
Chloride: 94 mmol/L — ABNORMAL LOW (ref 96–106)
Creatinine, Ser: 0.94 mg/dL (ref 0.76–1.27)
GFR calc Af Amer: 102 mL/min/{1.73_m2} (ref >59)
GFR calc non Af Amer: 88 mL/min/{1.73_m2} (ref >59)
Globulin, Total: 2.9 g/dL (ref 1.5–4.5)
Glucose: 189 mg/dL — ABNORMAL HIGH (ref 65–99)
Potassium: 4.6 mmol/L (ref 3.5–5.2)
Sodium: 137 mmol/L (ref 134–144)
Total Protein: 7.1 g/dL (ref 6.0–8.5)

## 2015-04-23 LAB — LIPID PANEL WITH LDL/HDL RATIO
Cholesterol, Total: 142 mg/dL (ref 100–199)
HDL: 69 mg/dL (ref >39)
LDL Calculated: 41 mg/dL (ref 0–99)
LDl/HDL Ratio: 0.6 ratio units (ref 0.0–3.6)
Triglycerides: 160 mg/dL — ABNORMAL HIGH (ref 0–149)
VLDL Cholesterol Cal: 32 mg/dL (ref 5–40)

## 2015-04-23 LAB — HEMOGLOBIN A1C
Est. average glucose Bld gHb Est-mCnc: 220 mg/dL
Hgb A1c MFr Bld: 9.3 % — ABNORMAL HIGH (ref 4.8–5.6)

## 2015-04-23 LAB — TSH: TSH: 2.34 u[IU]/mL (ref 0.450–4.500)

## 2015-04-28 ENCOUNTER — Telehealth: Payer: Self-pay

## 2015-04-28 NOTE — Telephone Encounter (Signed)
Pt advised of lab results, he states he was seen this month and we started him on Septra DS and he has been taking it since last week for urinary frequency and urgency but has not noticed any improvement so far. I discussed with him that urinary issues like that can also be coming from uncontrolled sugar. Please review. What should he do for the symptoms? He does not have a follow up for the urinary issues-aa

## 2015-04-28 NOTE — Telephone Encounter (Signed)
Try tamsulosin 0.4 milligrams daily. See me back 2 weeks.

## 2015-04-30 MED ORDER — TAMSULOSIN HCL 0.4 MG PO CAPS: 0.4000 mg | ORAL_CAPSULE | Freq: Every day | ORAL | Status: DC

## 2015-04-30 NOTE — Telephone Encounter (Signed)
Pt advised and he will call back to make appointment-aa

## 2015-05-27 ENCOUNTER — Telehealth: Payer: Self-pay | Admitting: Emergency Medicine

## 2015-05-27 ENCOUNTER — Other Ambulatory Visit: Payer: Self-pay | Admitting: Physician Assistant

## 2015-05-27 DIAGNOSIS — E119 Type 2 diabetes mellitus without complications: Secondary | ICD-10-CM

## 2015-05-27 MED ORDER — SITAGLIPTIN PHOSPHATE 50 MG PO TABS: 50.0000 mg | ORAL_TABLET | Freq: Every day | ORAL | Status: DC

## 2015-05-27 MED ORDER — GLIPIZIDE 5 MG PO TABS: 5.0000 mg | ORAL_TABLET | Freq: Every day | ORAL | Status: DC

## 2015-05-27 NOTE — Telephone Encounter (Signed)
LMTCB

## 2015-05-27 NOTE — Telephone Encounter (Signed)
This pt is a Dr. Rosanna Randy pt. Pt called stating that he has been seeing his rheumatologist and they did blood work and his blood sugar was in the 300's. He was told to let his PCP know. His last A1c was elevated at 9.3. Pt has been working on diet and exercise and has lost 30 pounds, so I think Dr. Rosanna Randy was going to let him work more on this with diet and exercise. He is also taking Prednisone and will be for for approximately the next 2-3 weeks per pt. Do you want to add anything short term? He is only taking Invokamet at this time for his diabetes. Please advise.

## 2015-05-27 NOTE — Telephone Encounter (Signed)
Will add januvia 50mg . May need to increase to 100mg  if still uncontrolled while on prednisone. Call if fasting sugars are staying elevated greater than 200. Rx sent to [facility].

## 2015-05-27 NOTE — Telephone Encounter (Signed)
Pt returned your call. Please leave a voice mail if you dont get him..  Thanks Con Memos

## 2015-05-27 NOTE — Telephone Encounter (Signed)
Patient advised as directed below. Patient requested to speak with [facility]. Tawanna Sat is aware patient would like to discuss recommendations with her.

## 2015-05-27 NOTE — Telephone Encounter (Signed)
Spoke with patient on the phone and explained why we do not do single insulin injections and he voiced understanding. He is going to take the [place] while he is on the prednisone for the next few weeks. He needs to call and schedule a f/u with Dr. Rosanna Randy to have his blood sugar checked. He is to call if blood sugars continue to increase.

## 2015-07-30 ENCOUNTER — Other Ambulatory Visit: Payer: Self-pay | Admitting: Physician Assistant

## 2015-07-30 ENCOUNTER — Ambulatory Visit (INDEPENDENT_AMBULATORY_CARE_PROVIDER_SITE_OTHER): Payer: BLUE CROSS/BLUE SHIELD | Admitting: Family Medicine

## 2015-07-30 VITALS — BP 110/60 | HR 76 | Temp 98.7°F | Resp 16 | Ht 72.0 in | Wt 274.0 lb

## 2015-07-30 DIAGNOSIS — M2550 Pain in unspecified joint: Secondary | ICD-10-CM

## 2015-07-30 DIAGNOSIS — E119 Type 2 diabetes mellitus without complications: Secondary | ICD-10-CM

## 2015-07-30 DIAGNOSIS — N529 Male erectile dysfunction, unspecified: Secondary | ICD-10-CM | POA: Diagnosis not present

## 2015-07-30 DIAGNOSIS — Z1211 Encounter for screening for malignant neoplasm of colon: Secondary | ICD-10-CM

## 2015-07-30 DIAGNOSIS — R748 Abnormal levels of other serum enzymes: Secondary | ICD-10-CM

## 2015-07-30 DIAGNOSIS — B372 Candidiasis of skin and nail: Secondary | ICD-10-CM

## 2015-07-30 DIAGNOSIS — Z1159 Encounter for screening for other viral diseases: Secondary | ICD-10-CM | POA: Diagnosis not present

## 2015-07-30 DIAGNOSIS — Z0000 Encounter for general adult medical examination without abnormal findings: Secondary | ICD-10-CM | POA: Diagnosis not present

## 2015-07-30 LAB — POCT URINALYSIS DIPSTICK
Bilirubin, UA: NEGATIVE
Blood, UA: NEGATIVE
Ketones, UA: NEGATIVE
Leukocytes, UA: NEGATIVE
Nitrite, UA: NEGATIVE
Spec Grav, UA: 1.01
Urobilinogen, UA: NEGATIVE
pH, UA: 6

## 2015-07-30 LAB — IFOBT (OCCULT BLOOD): IFOBT: NEGATIVE

## 2015-07-30 MED ORDER — GLIPIZIDE 5 MG PO TABS: 5.0000 mg | ORAL_TABLET | Freq: Every day | ORAL | Status: DC

## 2015-07-30 MED ORDER — SILDENAFIL CITRATE 20 MG PO TABS: 20.0000 mg | ORAL_TABLET | ORAL | Status: DC

## 2015-07-30 MED ORDER — NYSTATIN 100000 UNIT/GM EX CREA: 1.0000 "application " | TOPICAL_CREAM | Freq: Two times a day (BID) | CUTANEOUS | Status: DC

## 2015-07-30 NOTE — Progress Notes (Signed)
Patient ID: NICHOLSON HORNE, male   DOB: [birth year], 60 y.o.   MRN: [id number] Patient: Raymond Michael, Male    DOB: [birth year], 61 y.o.   MRN: [id number] Visit Date: 07/30/2015  Today's Provider: Wilhemena Durie, MD   Chief Complaint  Patient presents with  . Annual Exam   Subjective:  Raymond Michael is a 60 y.o. male who presents today for health maintenance and complete physical. He feels fairly well. He reports exercising 4 day per week. He reports he is sleeping well.   * Patient complains of rash under his left arm.  He has had it for about 1 month.  He states it does not itching, burning or have pustules.  * Patient would like a prescription for Viagra.  Patient was asking if he should still take Glipizide  * Patient complains of hip and knee pain to the point he is having difficulty doing exercises.  He is not currently on any antiinflammatory agent.  He does have narcotic pain medication but states he does not really take that now.  * Patient has never had Hep C screening.  He is due for his A1C.  He had labs done in March  2017 and 1 of his lever enzymes was still elevated.  Immunization History  Administered Date(s) Administered  . Influenza,inj,Quad PF,36+ Mos 10/28/2014  . Tdap 03/25/2010   04/26/10 Colonoscopy-internal hemorrhoids, f/u 10 years  Review of Systems  Constitutional: Positive for activity change (increased activity, exercising more).  HENT: Negative.   Eyes: Negative.   Respiratory: Negative.   Cardiovascular: Negative.   Gastrointestinal: Negative.   Endocrine: Negative.   Genitourinary: Negative.   Musculoskeletal: Positive for arthralgias.  Skin: Positive for rash (axillary for 1 month).  Allergic/Immunologic: Negative.   Neurological: Negative.   Hematological: Negative.   Psychiatric/Behavioral: Negative.     Social History   Social History  . Marital Status: Divorced    Spouse Name: N/A  . Number of Children: N/A  . Years of Education:  N/A   Occupational History  . Not on file.   Social History Main Topics  . Smoking status: Never Smoker   . Smokeless tobacco: Not on file  . Alcohol Use: Yes     Comment: 1-2 drinks per week  . Drug Use: No  . Sexual Activity: Not on file   Other Topics Concern  . Not on file   Social History Narrative    Patient Active Problem List   Diagnosis Date Noted  . Diabetes ([place]) 09/02/2014  . HLD (hyperlipidemia) 09/02/2014  . Adiposity 09/02/2014  . Obstructive apnea 09/02/2014  . Basal cell carcinoma of nose 01/02/2014    Past Surgical History  Procedure Laterality Date  . Tonsillectomy and adenoidectomy    . Nose surgery      His family history includes Diabetes in his brother and father.    Outpatient Prescriptions Prior to Visit  Medication Sig Dispense Refill  . glipiZIDE (GLUCOTROL) 5 MG tablet Take 1 tablet (5 mg total) by mouth daily before breakfast. 60 tablet 0  . HYDROcodone-acetaminophen (NORCO) 10-325 MG tablet Take 1 tablet by mouth every 6 (six) hours as needed. 30 tablet 0  . INVOKAMET [phone] MG TABS TAKE 1 TABLET BY MOUTH 2 (TWO) TIMES DAILY. 60 tablet 6  . simvastatin (ZOCOR) 10 MG tablet Take 1 tablet (10 mg total) by mouth daily. 90 tablet 3  . tamsulosin (FLOMAX) 0.4 MG CAPS capsule Take 1 capsule (0.4 mg total)  by mouth daily. 30 capsule 12  . predniSONE (DELTASONE) 20 MG tablet Take 3 tabs PO on day 1&2, 2 tabs PO on day 3&4, 1 tab PO until completed 20 tablet 0  . sulfamethoxazole-trimethoprim (BACTRIM DS,SEPTRA DS) 800-160 MG tablet Take 1 tablet by mouth 2 (two) times daily. 20 tablet 0   No facility-administered medications prior to visit.    Patient Care Team: Jerrol Banana., MD as PCP - General (Family Medicine)     Objective:   Vitals:  Filed Vitals:   07/30/15 0850  BP: 110/60  Pulse: 76  Temp: 98.7 F (37.1 C)  TempSrc: Oral  Resp: 16  Height: 6' (1.829 m)  Weight: 274 lb (124.286 kg)    Physical Exam   Constitutional: He is oriented to person, place, and time. He appears well-developed and well-nourished.  HENT:  Head: Normocephalic and atraumatic.  Right Ear: External ear normal.  Left Ear: External ear normal.  Nose: Nose normal.  Mouth/Throat: Oropharynx is clear and moist.  Eyes: Conjunctivae and EOM are normal. Pupils are equal, round, and reactive to light.  Neck: Normal range of motion. Neck supple.  Cardiovascular: Normal rate, regular rhythm, normal heart sounds and intact distal pulses.   Pulmonary/Chest: Effort normal and breath sounds normal.  Abdominal: Soft. Bowel sounds are normal.  Genitourinary: Rectum normal, prostate normal and penis normal.  Musculoskeletal: Normal range of motion.  Neurological: He is alert and oriented to person, place, and time.  Skin: Skin is warm and dry.  Psychiatric: He has a normal mood and affect. His behavior is normal. Judgment and thought content normal.  Dorsal surface of the left hand is mild but diffusely swollen. No obvious tenderness. He appears to have what is a yeast infection of the left axilla. Yeast versus tinea of the buttocks.  Depression Screen PHQ 2/9 Scores 10/28/2014  PHQ - 2 Score 0      Assessment & Plan:     Routine Health Maintenance and Physical Exam  Exercise Activities and Dietary recommendations Goals    None      Immunization History  Administered Date(s) Administered  . Influenza,inj,Quad PF,36+ Mos 10/28/2014  . Tdap 03/25/2010    Health Maintenance  Topic Date Due  . Hepatitis C Screening  1955-11-20  . PNEUMOCOCCAL POLYSACCHARIDE VACCINE (1) 09/29/1957  . FOOT EXAM  09/29/1965  . OPHTHALMOLOGY EXAM  09/29/1965  . URINE MICROALBUMIN  09/29/1965  . HIV Screening  09/30/1970  . COLONOSCOPY  09/29/2005  . INFLUENZA VACCINE  09/15/2015  . HEMOGLOBIN A1C  10/23/2015  . TETANUS/TDAP  03/25/2020      Discussed health benefits of physical activity, and encouraged him to engage in regular  exercise appropriate for his age and condition.  Yeast dermatitis of left axilla Treat with nystatin cream. BPH Better on Flomax Type 2 diabetes Likely osteoarthritis the right knee and right hip. Refer to Dr. 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 Dietary changes are discussed  Today. ED Generic Viagra written. He is advised about risk and benefits of same. I have done the exam and reviewed the above chart and it is accurate to the best of my knowledge.    ------------------------------------------------------------------------------------------------------------

## 2015-07-31 LAB — HEPATITIS C ANTIBODY: Hep C Virus Ab: <0.1 s/co ratio (ref 0.0–0.9)

## 2015-07-31 LAB — HEMOGLOBIN A1C
Est. average glucose Bld gHb Est-mCnc: 180 mg/dL
Hgb A1c MFr Bld: 7.9 % — ABNORMAL HIGH (ref 4.8–5.6)

## 2015-07-31 LAB — HEPATIC FUNCTION PANEL
ALT: 51 IU/L — ABNORMAL HIGH (ref 0–44)
AST: 56 IU/L — ABNORMAL HIGH (ref 0–40)
Albumin: 4.5 g/dL (ref 3.5–5.5)
Alkaline Phosphatase: 62 IU/L (ref 39–117)
Bilirubin Total: 0.3 mg/dL (ref 0.0–1.2)
Bilirubin, Direct: 0.14 mg/dL (ref 0.00–0.40)
Total Protein: 7.4 g/dL (ref 6.0–8.5)

## 2015-08-19 ENCOUNTER — Other Ambulatory Visit: Payer: Self-pay

## 2015-08-19 MED ORDER — CANAGLIFLOZIN-METFORMIN HCL 150-1000 MG PO TABS: 1.0000 | ORAL_TABLET | Freq: Two times a day (BID) | ORAL | Status: DC

## 2015-09-22 NOTE — Telephone Encounter (Signed)
error 

## 2015-11-16 ENCOUNTER — Ambulatory Visit (INDEPENDENT_AMBULATORY_CARE_PROVIDER_SITE_OTHER): Payer: 59 | Admitting: Family Medicine

## 2015-11-16 VITALS — BP 106/60 | HR 100 | Temp 97.9°F | Resp 16 | Wt 276.0 lb

## 2015-11-16 DIAGNOSIS — E119 Type 2 diabetes mellitus without complications: Secondary | ICD-10-CM

## 2015-11-16 LAB — POCT GLYCOSYLATED HEMOGLOBIN (HGB A1C): Hemoglobin A1C: 7.6

## 2015-11-16 NOTE — Progress Notes (Signed)
Raymond Michael  MRN: [id number] DOB: [birth year]  Subjective:  HPI   The patient is a 60 year old male who presents for follow up of his diabetes.  He was last seen on 07/30/15 and had an A1C of 7.9.  He states he does not check his glucose on a regular basis.  He complains of continued  Rash in hte axilla area.  He states he used the cream that was given but did not get relief.    He also states that he was seen by Rheumatology today and was started on Enbrel.  He is taking that once weekly.    When reviewing the patient's medications it is noted that he is on multiple anti-inflammatory medications.  He is currently using Acetaminophen 650 mg 4 per day, Ibuprofen PM 200 mg at night, Naproxen 500 mg BID and Parafon Forte on a rare occasion.  He had been on Methotrexate 6 per week but is now is on the Enbrel.  He was given a shot of both the Methotrexate and the Enbrel today but will not be on the Methotrexate any more.  Patient Active Problem List   Diagnosis Date Noted  . Diabetes ([place]) 09/02/2014  . HLD (hyperlipidemia) 09/02/2014  . Adiposity 09/02/2014  . Obstructive apnea 09/02/2014  . Basal cell carcinoma of nose 01/02/2014    No past medical history on file.  Social History   Social History  . Marital status: Divorced    Spouse name: N/A  . Number of children: N/A  . Years of education: N/A   Occupational History  . Not on file.   Social History Main Topics  . Smoking status: Never Smoker  . Smokeless tobacco: Not on file  . Alcohol use Yes     Comment: 1-2 drinks per week  . Drug use: No  . Sexual activity: Not on file   Other Topics Concern  . Not on file   Social History Narrative  . No narrative on file    Outpatient Encounter Prescriptions as of 11/16/2015  Medication Sig  . acetaminophen (ACETAMINOPHEN 8 HOUR) 650 MG CR tablet Take 650 mg by mouth every 8 (eight) hours as needed for pain.  . Canagliflozin-Metformin HCl (INVOKAMET) [phone] MG TABS Take  1 tablet by mouth 2 (two) times daily.  . chlorzoxazone (PARAFON) 500 MG tablet Take 500 mg by mouth 4 (four) times daily as needed for muscle spasms.  [name] etanercept (ENBREL SURECLICK) 50 MG/ML injection Inject 50 mg into the skin once a week.  . folic acid (FOLVITE) 1 MG tablet Take 1 mg by mouth daily.  [name] glipiZIDE (GLUCOTROL) 5 MG tablet Take 1 tablet (5 mg total) by mouth daily before breakfast.  . naproxen (NAPROSYN) 500 MG tablet Take 500 mg by mouth 2 (two) times daily with a meal.  . sildenafil (REVATIO) 20 MG tablet Take 1 tablet (20 mg total) by mouth See admin instructions. 1-4 daily as needed  . simvastatin (ZOCOR) 10 MG tablet Take 1 tablet (10 mg total) by mouth daily.  . tamsulosin (FLOMAX) 0.4 MG CAPS capsule Take 1 capsule (0.4 mg total) by mouth daily.  . [DISCONTINUED] HYDROcodone-acetaminophen (NORCO) 10-325 MG tablet Take 1 tablet by mouth every 6 (six) hours as needed.  . [DISCONTINUED] nystatin cream (MYCOSTATIN) Apply 1 application topically 2 (two) times daily.   No facility-administered encounter medications on file as of 11/16/2015.     No Known Allergies  Review of Systems  Constitutional: Negative.  Negative for fever and malaise/fatigue.  Eyes: Negative.   Respiratory: Negative for cough, shortness of breath and wheezing.   Cardiovascular: Negative for chest pain, palpitations, orthopnea and leg swelling.  Gastrointestinal: Negative.   Genitourinary: Negative for frequency.  Musculoskeletal: Positive for joint pain, myalgias and neck pain. Negative for back pain and falls.  Skin: Negative.   Neurological: Negative for dizziness, weakness and headaches.  Endo/Heme/Allergies: Negative.  Negative for polydipsia.  Psychiatric/Behavioral: Negative.    Objective:  BP 106/60   Pulse 100   Temp 97.9 F (36.6 C) (Oral)   Resp 16   Wt 276 lb (125.2 kg)   BMI 37.43 kg/m   Physical Exam  Constitutional: He is oriented to person, place, and time and  well-developed, well-nourished, and in no distress.  Well-nourished well-developed mildly obese white male in no acute distress  HENT:  Head: Normocephalic and atraumatic.  Right Ear: External ear normal.  Left Ear: External ear normal.  Nose: Nose normal.  Eyes: Conjunctivae are normal. No scleral icterus.  Neck: Neck supple. No thyromegaly present.  Cardiovascular: Normal rate, regular rhythm and normal heart sounds.   Pulmonary/Chest: Effort normal and breath sounds normal.  Abdominal: Soft.  Neurological: He is alert and oriented to person, place, and time.  Skin: Skin is warm and dry.  Psychiatric: Mood, memory, affect and judgment normal.    Assessment and Plan :  1. Type 2 diabetes mellitus without complication, unspecified long term insulin use status ([place]) Diet and exercise habits stressed. - POCT glycosylated hemoglobin (Hb A1C)--7.6 today. 2. Obesity 3. Hyperlipidemia 4. ED 5. Sleep apnea  6. Autoimmune/HLA-B27 arthropathy [facility] rheumatology earlier today. Medication adjusted. with ARB and careful with this patient because of his lack of follow-up. He is fairly noncompliant with office follow-up. 7. Yeast dermatitis of axilla There is still erythema without scaling, breakdown of skin, or any induration or sign of infection. Think further nystatin treatment would help. At this time keep area clean but there is no skin breakdown at all. I recommended dermatology referral if this rash returns I have done the exam and reviewed the chart and it is accurate to the best of my knowledge. Miguel Aschoff M.D. [facility]

## 2015-12-14 ENCOUNTER — Ambulatory Visit: Payer: BLUE CROSS/BLUE SHIELD | Admitting: Family Medicine

## 2015-12-15 ENCOUNTER — Other Ambulatory Visit: Payer: Self-pay

## 2015-12-16 MED ORDER — NAPROXEN 500 MG PO TABS: 500.0000 mg | ORAL_TABLET | Freq: Two times a day (BID) | ORAL | 3 refills | Status: DC

## 2015-12-16 MED ORDER — CANAGLIFLOZIN-METFORMIN HCL 150-1000 MG PO TABS: 1.0000 | ORAL_TABLET | Freq: Two times a day (BID) | ORAL | 3 refills | Status: DC

## 2015-12-16 MED ORDER — TAMSULOSIN HCL 0.4 MG PO CAPS: 0.4000 mg | ORAL_CAPSULE | Freq: Every day | ORAL | 3 refills | Status: DC

## 2015-12-29 ENCOUNTER — Telehealth: Payer: Self-pay | Admitting: Family Medicine

## 2015-12-29 NOTE — Telephone Encounter (Signed)
Pt advised that we actually do not have any samples they are expired-aa

## 2015-12-29 NOTE — Telephone Encounter (Signed)
Yes--up to 3 weeks.

## 2015-12-29 NOTE — Telephone Encounter (Signed)
Ok to do?-aa 

## 2015-12-29 NOTE — Telephone Encounter (Signed)
Pt needs samples of Invokamet [phone] mg.  His mail order mill not be here for a couple weeks and he is out.  He said he was coming by today.  Thanks, [employer]

## 2016-03-21 ENCOUNTER — Ambulatory Visit: Payer: 59 | Admitting: Family Medicine

## 2016-05-26 ENCOUNTER — Ambulatory Visit (INDEPENDENT_AMBULATORY_CARE_PROVIDER_SITE_OTHER): Payer: Self-pay | Admitting: Family Medicine

## 2016-05-26 ENCOUNTER — Encounter: Payer: Self-pay | Admitting: Family Medicine

## 2016-05-26 VITALS — BP 110/74 | HR 80 | Temp 98.0°F | Resp 16 | Wt 284.0 lb

## 2016-05-26 DIAGNOSIS — E7800 Pure hypercholesterolemia, unspecified: Secondary | ICD-10-CM

## 2016-05-26 DIAGNOSIS — M26622 Arthralgia of left temporomandibular joint: Secondary | ICD-10-CM

## 2016-05-26 DIAGNOSIS — E119 Type 2 diabetes mellitus without complications: Secondary | ICD-10-CM

## 2016-05-26 DIAGNOSIS — N529 Male erectile dysfunction, unspecified: Secondary | ICD-10-CM

## 2016-05-26 LAB — POCT GLYCOSYLATED HEMOGLOBIN (HGB A1C)
Est. average glucose Bld gHb Est-mCnc: 194
Hemoglobin A1C: 8.4

## 2016-05-26 MED ORDER — GLIPIZIDE 5 MG PO TABS: 5.0000 mg | ORAL_TABLET | Freq: Every day | ORAL | 12 refills | Status: DC

## 2016-05-26 MED ORDER — CYCLOBENZAPRINE HCL 10 MG PO TABS: 10.0000 mg | ORAL_TABLET | Freq: Every day | ORAL | 0 refills | Status: DC

## 2016-05-26 MED ORDER — METFORMIN HCL 1000 MG PO TABS: 1000.0000 mg | ORAL_TABLET | Freq: Two times a day (BID) | ORAL | 3 refills | Status: DC

## 2016-05-26 MED ORDER — SILDENAFIL CITRATE 20 MG PO TABS: 20.0000 mg | ORAL_TABLET | ORAL | 11 refills | Status: DC

## 2016-05-26 NOTE — Progress Notes (Signed)
Patient: Raymond Michael Male    DOB: 08-01-1955   61 y.o.   MRN: [id number] Visit Date: 05/26/2016  Today's Provider: Wilhemena Durie, MD   Chief Complaint  Patient presents with  . Diabetes  . Hyperlipidemia   Subjective:    HPI      Diabetes Mellitus Type II, Follow-up:   Lab Results  Component Value Date   HGBA1C 7.6 11/16/2015   HGBA1C 7.9 (H) 07/30/2015   HGBA1C 9.3 (H) 04/22/2015    Last seen for diabetes 6 months ago.  Management since then includes stressing diet and exercise. He reports fair compliance with treatment. Pt is not taking Glipizide. He is not having side effects.  Current symptoms include paresthesia of the feet and have been unchanged. Home blood sugar records: fasting range: 140  Episodes of hypoglycemia? no   Current Insulin Regimen: N/A Most Recent Eye Exam: a year ago Weight trend: increasing steadily Prior visit with dietician: no Current diet: in general, a "healthy" diet  Pt is drinking more water. Current exercise: none due to arthralgias  Pertinent Labs:    Component Value Date/Time   CHOL 142 04/22/2015 0854   TRIG 160 (H) 04/22/2015 0854   HDL 69 04/22/2015 0854   LDLCALC 41 04/22/2015 0854   CREATININE 0.94 04/22/2015 0854    Wt Readings from Last 3 Encounters:  05/26/16 284 lb (128.8 kg)  11/16/15 276 lb (125.2 kg)  07/30/15 274 lb (124.3 kg)    ------------------------------------------------------------------------  Lipid/Cholesterol, Follow-up:   Last seen for this6 months ago.  Management changes since that visit include none. . Last Lipid Panel:    Component Value Date/Time   CHOL 142 04/22/2015 0854   TRIG 160 (H) 04/22/2015 0854   HDL 69 04/22/2015 0854   LDLCALC 41 04/22/2015 0854   Pt is not taking Simvastatin.  Wt Readings from Last 3 Encounters:  05/26/16 284 lb (128.8 kg)  11/16/15 276 lb (125.2 kg)  07/30/15 274 lb (124.3 kg)     -------------------------------------------------------------------    No Known Allergies   Current Outpatient Prescriptions:  .  acetaminophen (ACETAMINOPHEN 8 HOUR) 650 MG CR tablet, Take 650 mg by mouth every 8 (eight) hours as needed for pain., Disp: , Rfl:  .  Canagliflozin-Metformin HCl (INVOKAMET) [phone] MG TABS, Take 1 tablet by mouth 2 (two) times daily., Disp: 180 tablet, Rfl: 3 .  etanercept (ENBREL SURECLICK) 50 MG/ML injection, Inject 50 mg into the skin once a week., Disp: , Rfl:  .  naproxen (NAPROSYN) 500 MG tablet, Take 1 tablet (500 mg total) by mouth 2 (two) times daily with a meal., Disp: 180 tablet, Rfl: 3 .  traMADol (ULTRAM) 50 MG tablet, Take by mouth every 8 (eight) hours as needed., Disp: , Rfl:  .  chlorzoxazone (PARAFON) 500 MG tablet, Take 500 mg by mouth 4 (four) times daily as needed for muscle spasms., Disp: , Rfl:  .  glipiZIDE (GLUCOTROL) 5 MG tablet, Take 1 tablet (5 mg total) by mouth daily before breakfast. (Patient not taking: Reported on 05/26/2016), Disp: 60 tablet, Rfl: 12 .  sildenafil (REVATIO) 20 MG tablet, Take 1 tablet (20 mg total) by mouth See admin instructions. 1-4 daily as needed (Patient not taking: Reported on 05/26/2016), Disp: 50 tablet, Rfl: 12 .  simvastatin (ZOCOR) 10 MG tablet, Take 1 tablet (10 mg total) by mouth daily. (Patient not taking: Reported on 05/26/2016), Disp: 90 tablet, Rfl: 3 .  tamsulosin (FLOMAX) 0.4  MG CAPS capsule, Take 1 capsule (0.4 mg total) by mouth daily. (Patient not taking: Reported on 05/26/2016), Disp: 90 capsule, Rfl: 3  Review of Systems  Constitutional: Negative for activity change, appetite change, chills, diaphoresis, fatigue, fever and unexpected weight change.  Respiratory: Negative for shortness of breath.   Cardiovascular: Negative for chest pain, palpitations and leg swelling.  Endocrine: Negative for polydipsia, polyphagia and polyuria.  Musculoskeletal:       Jaw Pain present   Allergic/Immunologic: Negative.   Psychiatric/Behavioral: Negative.     Social History  Substance Use Topics  . Smoking status: Never Smoker  . Smokeless tobacco: Never Used  . Alcohol use Yes     Comment: 1-2 drinks per month   Objective:   BP 110/74 (BP Location: Right Arm, Patient Position: Sitting, Cuff Size: Large)   Pulse 80   Temp 98 F (36.7 C) (Oral)   Resp 16   Wt 284 lb (128.8 kg)   BMI 38.52 kg/m  Vitals:   05/26/16 1032  BP: 110/74  Pulse: 80  Resp: 16  Temp: 98 F (36.7 C)  TempSrc: Oral  Weight: 284 lb (128.8 kg)     Physical Exam  Constitutional: He appears well-developed and well-nourished.  HENT:  Head: Normocephalic and atraumatic.  Right Ear: External ear normal.  Left Ear: External ear normal.  Nose: Nose normal.  Eyes: Conjunctivae are normal.  Neck: Normal range of motion. Neck supple. No thyromegaly present.  Cardiovascular: Normal rate, regular rhythm and normal heart sounds.   Pulmonary/Chest: Effort normal and breath sounds normal. No respiratory distress.  Abdominal: Soft.  Musculoskeletal: He exhibits no edema.  Skin: Skin is warm and dry.  Psychiatric: He has a normal mood and affect. His behavior is normal. Judgment and thought content normal.        Assessment & Plan:     1. Type 2 diabetes mellitus without complication, unspecified long term insulin use status ([facility]) Worsening. Pt is self pay. Invokana samples given, and Metformin and Glipizide printed for refills. FU 3 months. - POCT glycosylated hemoglobin (Hb A1C) - metFORMIN (GLUCOPHAGE) 1000 MG tablet; Take 1 tablet (1,000 mg total) by mouth 2 (two) times daily with a meal.  Dispense: 60 tablet; Refill: 3 Results for orders placed or performed in visit on 05/26/16  POCT glycosylated hemoglobin (Hb A1C)  Result Value Ref Range   Hemoglobin A1C 8.4    Est. average glucose Bld gHb Est-mCnc 194      2. Pure hypercholesterolemia Will check labs at next OV due to no  insurance.  3. Arthralgia of left temporomandibular joint New problem. Advised pt to use heat and use naproxen for this.  4. Erectile dysfunction, unspecified erectile dysfunction type Refills provided. - sildenafil (REVATIO) 20 MG tablet; Take 1 tablet (20 mg total) by mouth See admin instructions. 1-4 daily as needed  Dispense: 50 tablet; Refill: 11  5. Type 2 diabetes mellitus without complication, without long-term current use of insulin (HCC)  - glipiZIDE (GLUCOTROL) 5 MG tablet; Take 1 tablet (5 mg total) by mouth daily before breakfast.  Dispense: 30 tablet; Refill: 12     Patient seen and examined by Miguel Aschoff, MD, and note scribed by Renaldo Fiddler, CMA. I have done the exam and reviewed the above chart and it is accurate to the best of my knowledge. Development worker, community has been used in this note in any air is in the dictation or transcription are unintentional.  Wilhemena Durie, MD  [facility]

## 2016-07-14 ENCOUNTER — Other Ambulatory Visit: Payer: Self-pay | Admitting: Family Medicine

## 2016-07-14 ENCOUNTER — Other Ambulatory Visit: Payer: Self-pay

## 2016-07-14 DIAGNOSIS — M26622 Arthralgia of left temporomandibular joint: Secondary | ICD-10-CM

## 2016-07-14 DIAGNOSIS — E119 Type 2 diabetes mellitus without complications: Secondary | ICD-10-CM

## 2016-07-14 MED ORDER — GLIPIZIDE 5 MG PO TABS: 5.0000 mg | ORAL_TABLET | Freq: Every day | ORAL | 12 refills | Status: DC

## 2016-07-14 MED ORDER — CANAGLIFLOZIN 300 MG PO TABS: 300.0000 mg | ORAL_TABLET | Freq: Every day | ORAL | 12 refills | Status: DC

## 2016-07-14 MED ORDER — METFORMIN HCL 1000 MG PO TABS: 1000.0000 mg | ORAL_TABLET | Freq: Two times a day (BID) | ORAL | 12 refills | Status: DC

## 2016-07-18 ENCOUNTER — Telehealth: Payer: Self-pay | Admitting: Family Medicine

## 2016-07-18 MED ORDER — TAMSULOSIN HCL 0.4 MG PO CAPS: 0.4000 mg | ORAL_CAPSULE | Freq: Every day | ORAL | 3 refills | Status: DC

## 2016-07-18 NOTE — Telephone Encounter (Signed)
Reviewed patient's chart. Patient per our record was put on Tamsulosin in march 2017, I advised patient of this and that we refilled this medication in November 2017 to [facility] but patient is not using that pharmacy anymore. Re sent the medication to [facility] per patients request. Advised patient to take this medication every day even if the symptoms are improved. Patient understood. He will check at home and see if he is taking this medication already or not-aa

## 2016-07-18 NOTE — Telephone Encounter (Signed)
Pt states he has taken a Rx in the past that helped with having frequency to void.  Pt is asking if he is still taking this medication or can he get a refill for this medication.  Pt is not sure of the name.  Pt is aware Dr Rosanna Randy is out of the office but does not want and appointment with another provider or Dr Rosanna Randy.  Pt request a note sent back to Dr Rosanna Randy.  [place]  CY#[phone]/[id number]

## 2016-08-04 ENCOUNTER — Telehealth: Payer: Self-pay

## 2016-08-04 DIAGNOSIS — E119 Type 2 diabetes mellitus without complications: Secondary | ICD-10-CM

## 2016-08-04 MED ORDER — CANAGLIFLOZIN 300 MG PO TABS: 300.0000 mg | ORAL_TABLET | Freq: Every day | ORAL | 12 refills | Status: DC

## 2016-08-04 NOTE — Telephone Encounter (Signed)
Pt was given samples of Invokana, and is request rx sent to pharmacy. Denies side effects. Med sent in. Renaldo Fiddler, CMA

## 2016-08-19 ENCOUNTER — Other Ambulatory Visit: Payer: Self-pay

## 2016-08-19 ENCOUNTER — Other Ambulatory Visit: Payer: Self-pay | Admitting: Physician Assistant

## 2016-08-19 MED ORDER — NAPROXEN 500 MG PO TABS: 500.0000 mg | ORAL_TABLET | Freq: Two times a day (BID) | ORAL | 0 refills | Status: DC

## 2016-08-19 MED ORDER — NAPROXEN 500 MG PO TABS
500.0000 mg | ORAL_TABLET | Freq: Two times a day (BID) | ORAL | 0 refills | Status: DC
Start: 2016-08-19 — End: 2016-08-19

## 2016-08-19 NOTE — Telephone Encounter (Signed)
Need to address this on appointment on 08/29/16-aa

## 2016-08-19 NOTE — Telephone Encounter (Signed)
Patient called asking for refill on Naproxen sent to [facility]. Asked patient what he is taking medication for and he said "well Dr Rosanna Randy prescribed this medication and to be honest I do not know" he is taking twice daily because that is what RX says. I advised patient usually this is something for pain or something and patient states he does have joint pain. Patient has 2 tablets left. I advised patient that I will check and see if someone else can look at this for Dr Rometta Emery

## 2016-08-29 ENCOUNTER — Ambulatory Visit: Payer: Self-pay | Admitting: Family Medicine

## 2016-09-13 ENCOUNTER — Other Ambulatory Visit: Payer: Self-pay | Admitting: Family Medicine

## 2016-09-13 MED ORDER — NAPROXEN 500 MG PO TABS: 500.0000 mg | ORAL_TABLET | Freq: Two times a day (BID) | ORAL | 5 refills | Status: DC | PRN

## 2016-09-13 NOTE — Telephone Encounter (Signed)
[facility] pharmacy faxed a request for a 90-days supply for the following medication. Thanks CC  naproxen (NAPROSYN) 500 MG tablet  >Take 1 tablet ( 500 MG Total ) by mouth 2 (two) times daily.

## 2016-09-22 ENCOUNTER — Other Ambulatory Visit: Payer: Self-pay | Admitting: Family Medicine

## 2016-09-22 MED ORDER — NAPROXEN 500 MG PO TABS: 500.0000 mg | ORAL_TABLET | Freq: Two times a day (BID) | ORAL | 1 refills | Status: DC | PRN

## 2016-09-22 NOTE — Telephone Encounter (Signed)
[facility] pharmacy faxed a request for a 90-days supply for the following medication. Thanks CC  naproxen (NAPROSYN) 500 MG tablet  >Take 1 tablet ( 500 MG total ) by mouth 2 (two) times daily with a meal.

## 2017-02-20 ENCOUNTER — Ambulatory Visit: Payer: BLUE CROSS/BLUE SHIELD | Admitting: Family Medicine

## 2017-02-20 ENCOUNTER — Other Ambulatory Visit: Payer: Self-pay

## 2017-02-20 VITALS — BP 118/64 | HR 64 | Temp 97.9°F | Resp 16 | Wt 279.0 lb

## 2017-02-20 DIAGNOSIS — L42 Pityriasis rosea: Secondary | ICD-10-CM

## 2017-02-20 DIAGNOSIS — E119 Type 2 diabetes mellitus without complications: Secondary | ICD-10-CM | POA: Diagnosis not present

## 2017-02-20 DIAGNOSIS — E7800 Pure hypercholesterolemia, unspecified: Secondary | ICD-10-CM

## 2017-02-20 MED ORDER — TRIAMCINOLONE ACETONIDE 0.1 % EX CREA: 1.0000 "application " | TOPICAL_CREAM | Freq: Two times a day (BID) | CUTANEOUS | 0 refills | Status: DC

## 2017-02-20 NOTE — Progress Notes (Signed)
Raymond Michael  MRN: [id number] DOB: 13-Apr-1955  Subjective:  HPI   The patient is a 62 year old male who presents for follow up of chronic illness.  He was last seen on 05/26/16.  His last labs were reviewed and revealed last A1C was on 05/26/16 and was 8.4.  No other labs were reported in 2018.   Pt very noncompliant. He is currently on Glipizide and Metformin for his diabetes.  He does not recall being on the Invokana as our notes indicated in the past.    He does not check his glucose regularly but states maybe once a month and when he haws it was about 160.  He is also not taking his Simvastatin as was indicated in the last visit.  He was to return for follow up on that but had not until now.   The patient is also complaining of an itchy, red rash on his arms and back.  He has noticed this for about 1 month.  He denies any changes in personal hygiene products, soap detergents.  No medication or dietary changes that he can relate to the rash.  He states he did get his flu shot about 1 month ago.  Patient Active Problem List   Diagnosis Date Noted  . Diabetes ([place]) 09/02/2014  . HLD (hyperlipidemia) 09/02/2014  . Adiposity 09/02/2014  . Obstructive apnea 09/02/2014  . Basal cell carcinoma of nose 01/02/2014    No past medical history on file.  Social History   Socioeconomic History  . Marital status: Divorced    Spouse name: Not on file  . Number of children: Not on file  . Years of education: Not on file  . Highest education level: Not on file  Social Needs  . Financial resource strain: Not on file  . Food insecurity - worry: Not on file  . Food insecurity - inability: Not on file  . Transportation needs - medical: Not on file  . Transportation needs - non-medical: Not on file  Occupational History  . Not on file  Tobacco Use  . Smoking status: Never Smoker  . Smokeless tobacco: Never Used  Substance and Sexual Activity  . Alcohol use: Yes    Comment: 1-2 drinks  per month  . Drug use: No  . Sexual activity: Not on file  Other Topics Concern  . Not on file  Social History Narrative  . Not on file    Outpatient Encounter Medications as of 02/20/2017  Medication Sig  . acetaminophen (ACETAMINOPHEN 8 HOUR) 650 MG CR tablet Take 650 mg by mouth every 8 (eight) hours as needed for pain.  [name] etanercept (ENBREL SURECLICK) 50 MG/ML injection Inject 50 mg into the skin once a week.  [name] glipiZIDE (GLUCOTROL) 5 MG tablet Take 1 tablet (5 mg total) by mouth daily before breakfast.  . metFORMIN (GLUCOPHAGE) 1000 MG tablet Take 1 tablet (1,000 mg total) by mouth 2 (two) times daily with a meal.  . naproxen (NAPROSYN) 500 MG tablet Take 1 tablet (500 mg total) by mouth 2 (two) times daily as needed.  . sildenafil (REVATIO) 20 MG tablet Take 1 tablet (20 mg total) by mouth See admin instructions. 1-4 daily as needed  . tamsulosin (FLOMAX) 0.4 MG CAPS capsule Take 1 capsule (0.4 mg total) by mouth daily.  . canagliflozin (INVOKANA) 300 MG TABS tablet Take 1 tablet (300 mg total) by mouth daily before breakfast. (Patient not taking: Reported on 02/20/2017)  . [DISCONTINUED] Canagliflozin-Metformin  HCl (INVOKAMET) [phone] MG TABS Take 1 tablet by mouth 2 (two) times daily.  . [DISCONTINUED] chlorzoxazone (PARAFON) 500 MG tablet Take 500 mg by mouth 4 (four) times daily as needed for muscle spasms.  . [DISCONTINUED] cyclobenzaprine (FLEXERIL) 10 MG tablet Take 1 tablet (10 mg total) by mouth at bedtime.  . [DISCONTINUED] simvastatin (ZOCOR) 10 MG tablet Take 1 tablet (10 mg total) by mouth daily. (Patient not taking: Reported on 05/26/2016)  . [DISCONTINUED] traMADol (ULTRAM) 50 MG tablet Take by mouth every 8 (eight) hours as needed.   No facility-administered encounter medications on file as of 02/20/2017.     No Known Allergies  Review of Systems  Constitutional: Negative for fever and malaise/fatigue.  HENT: Negative.   Eyes: Negative.   Respiratory: Negative for  cough, shortness of breath and wheezing.   Cardiovascular: Negative for chest pain, palpitations, orthopnea, claudication and leg swelling.  Genitourinary: Positive for frequency.       Some ED.  Musculoskeletal: Positive for joint pain.  Skin: Negative.   Neurological: Negative.  Negative for weakness.  Endo/Heme/Allergies: Negative for polydipsia.  Psychiatric/Behavioral: Negative.     Objective:  BP 118/64 (BP Location: Right Arm, Patient Position: Sitting, Cuff Size: Normal)   Pulse 64   Temp 97.9 F (36.6 C) (Oral)   Resp 16   Wt 279 lb (126.6 kg)   BMI 37.84 kg/m   Physical Exam  Constitutional: He is oriented to person, place, and time and well-developed, well-nourished, and in no distress.  HENT:  Head: Normocephalic and atraumatic.  Eyes: Pupils are equal, round, and reactive to light.  Neck: Normal range of motion.  Cardiovascular: Normal rate, regular rhythm and normal heart sounds.  Pulmonary/Chest: Effort normal and breath sounds normal.  Abdominal: Soft.  Musculoskeletal: He exhibits edema (trace).  Neurological: He is alert and oriented to person, place, and time.  Skin: Skin is warm and dry. Rash (upper torso and arms) noted.  Psychiatric: Mood, memory, affect and judgment normal.   Diabetic Foot Exam - Simple   Simple Foot Form Diabetic Foot exam was performed with the following findings:  Yes 02/20/2017 10:10 AM  Visual Inspection No deformities, no ulcerations, no other skin breakdown bilaterally:  Yes Sensation Testing Intact to touch and monofilament testing bilaterally:  Yes Pulse Check Posterior Tibialis and Dorsalis pulse intact bilaterally:  Yes Comments      Assessment and Plan :   1. Type 2 diabetes mellitus without complication, unspecified whether long term insulin use (HCC)  - CBC with Differential/Platelet - Comprehensive metabolic panel - Lipid Panel With LDL/HDL Ratio - TSH - Hemoglobin A1c  2. Pure hypercholesterolemia  -  Lipid Panel With LDL/HDL Ratio - TSH  3. Pityriasis rosea Patient is to follow up with his rheumatologist and if they do not think this is from his Enbrel we can refer to dermatology.  - triamcinolone cream (KENALOG) 0.1 %; Apply 1 application topically 2 (two) times daily.  Dispense: 30 g; Refill: 0 4.HLA B27 Arthropathy Pre rheumatology. 5.Noncompliance 6.Obesity     I,Elena D DeSanto,acting as a scribe for Wilhemena Durie, MD.,have documented all relevant documentation on the behalf of Wilhemena Durie, MD,as directed by  Wilhemena Durie, MD while in the presence of Wilhemena Durie, MD. I have done the exam and reviewed the chart and it is accurate to the best of my knowledge. Development worker, community has been used and  any errors in dictation or transcription are unintentional. Delfino Lovett  Rosanna Randy M.D. [facility]

## 2017-02-21 LAB — CBC WITH DIFFERENTIAL/PLATELET
Basophils Absolute: 0 10*3/uL (ref 0.0–0.2)
Basos: 1 %
EOS (ABSOLUTE): 0.3 10*3/uL (ref 0.0–0.4)
Eos: 6 %
Hematocrit: 44.4 % (ref 37.5–51.0)
Hemoglobin: 14.8 g/dL (ref 13.0–17.7)
Immature Grans (Abs): 0 10*3/uL (ref 0.0–0.1)
Immature Granulocytes: 0 %
Lymphocytes Absolute: 2 10*3/uL (ref 0.7–3.1)
Lymphs: 46 %
MCH: 30.6 pg (ref 26.6–33.0)
MCHC: 33.3 g/dL (ref 31.5–35.7)
MCV: 92 fL (ref 79–97)
Monocytes Absolute: 0.4 10*3/uL (ref 0.1–0.9)
Monocytes: 9 %
Neutrophils Absolute: 1.6 10*3/uL (ref 1.4–7.0)
Neutrophils: 38 %
Platelets: 172 10*3/uL (ref 150–379)
RBC: 4.83 x10E6/uL (ref 4.14–5.80)
RDW: 14.5 % (ref 12.3–15.4)
WBC: 4.3 10*3/uL (ref 3.4–10.8)

## 2017-02-21 LAB — LIPID PANEL WITH LDL/HDL RATIO
Cholesterol, Total: 191 mg/dL (ref 100–199)
HDL: 43 mg/dL (ref >39)
LDL Calculated: 115 mg/dL — ABNORMAL HIGH (ref 0–99)
LDl/HDL Ratio: 2.7 ratio (ref 0.0–3.6)
Triglycerides: 164 mg/dL — ABNORMAL HIGH (ref 0–149)
VLDL Cholesterol Cal: 33 mg/dL (ref 5–40)

## 2017-02-21 LAB — HEMOGLOBIN A1C
Est. average glucose Bld gHb Est-mCnc: 306 mg/dL
Hgb A1c MFr Bld: 12.3 % — ABNORMAL HIGH (ref 4.8–5.6)

## 2017-02-21 LAB — COMPREHENSIVE METABOLIC PANEL
ALT: 102 IU/L — ABNORMAL HIGH (ref 0–44)
AST: 89 IU/L — ABNORMAL HIGH (ref 0–40)
Albumin/Globulin Ratio: 1.3 (ref 1.2–2.2)
Albumin: 4.3 g/dL (ref 3.6–4.8)
Alkaline Phosphatase: 72 IU/L (ref 39–117)
BUN/Creatinine Ratio: 13 (ref 10–24)
BUN: 12 mg/dL (ref 8–27)
Bilirubin Total: 0.5 mg/dL (ref 0.0–1.2)
CO2: 20 mmol/L (ref 20–29)
Calcium: 9.4 mg/dL (ref 8.6–10.2)
Chloride: 99 mmol/L (ref 96–106)
Creatinine, Ser: 0.9 mg/dL (ref 0.76–1.27)
GFR calc Af Amer: 106 mL/min/{1.73_m2} (ref >59)
GFR calc non Af Amer: 92 mL/min/{1.73_m2} (ref >59)
Globulin, Total: 3.4 g/dL (ref 1.5–4.5)
Glucose: 273 mg/dL — ABNORMAL HIGH (ref 65–99)
Potassium: 4.8 mmol/L (ref 3.5–5.2)
Sodium: 136 mmol/L (ref 134–144)
Total Protein: 7.7 g/dL (ref 6.0–8.5)

## 2017-02-21 LAB — TSH: TSH: 3.35 u[IU]/mL (ref 0.450–4.500)

## 2017-02-23 ENCOUNTER — Telehealth: Payer: Self-pay

## 2017-02-23 MED ORDER — ROSUVASTATIN CALCIUM 10 MG PO TABS: 10.0000 mg | ORAL_TABLET | Freq: Every day | ORAL | 3 refills | Status: DC

## 2017-02-23 NOTE — Telephone Encounter (Signed)
Pt advised. RX for Crestor sent in and follow up appointment Brynda Rim, RMA

## 2017-02-23 NOTE — Telephone Encounter (Signed)
-----   Message from Jerrol Banana., MD sent at 02/23/2017 12:32 PM EST ----- DM much worse. Back on Invokana 300mg  and stay on it. Liver mildly inflamed==probably fatty liver but would stop tylenol and alcohol. Also lipids should be treated--wouild start Crestor 10mg  daily. Check BS q am and RTC 1 month on Crestor and Invokana.

## 2017-03-03 ENCOUNTER — Encounter: Payer: Self-pay | Admitting: Family Medicine

## 2017-03-27 ENCOUNTER — Other Ambulatory Visit: Payer: Self-pay

## 2017-03-27 ENCOUNTER — Ambulatory Visit: Payer: BLUE CROSS/BLUE SHIELD | Admitting: Family Medicine

## 2017-03-27 VITALS — BP 138/80 | HR 70 | Temp 97.6°F | Resp 16 | Wt 283.0 lb

## 2017-03-27 DIAGNOSIS — E6609 Other obesity due to excess calories: Secondary | ICD-10-CM | POA: Diagnosis not present

## 2017-03-27 DIAGNOSIS — E1140 Type 2 diabetes mellitus with diabetic neuropathy, unspecified: Secondary | ICD-10-CM

## 2017-03-27 DIAGNOSIS — M1280 Other specific arthropathies, not elsewhere classified, unspecified site: Secondary | ICD-10-CM | POA: Diagnosis not present

## 2017-03-27 DIAGNOSIS — E7800 Pure hypercholesterolemia, unspecified: Secondary | ICD-10-CM | POA: Diagnosis not present

## 2017-03-27 DIAGNOSIS — M459 Ankylosing spondylitis of unspecified sites in spine: Secondary | ICD-10-CM

## 2017-03-27 DIAGNOSIS — Z6838 Body mass index (BMI) 38.0-38.9, adult: Secondary | ICD-10-CM | POA: Diagnosis not present

## 2017-03-27 DIAGNOSIS — G4733 Obstructive sleep apnea (adult) (pediatric): Secondary | ICD-10-CM | POA: Diagnosis not present

## 2017-03-27 MED ORDER — GABAPENTIN 100 MG PO CAPS: 100.0000 mg | ORAL_CAPSULE | Freq: Two times a day (BID) | ORAL | 12 refills | Status: DC

## 2017-03-27 NOTE — Patient Instructions (Addendum)
Take Metformin twice a day, Invokana once a day

## 2017-03-27 NOTE — Progress Notes (Signed)
Raymond Michael  MRN: [id number] DOB: [birth year]  Subjective:  HPI   The patient is a 62 year old male who presents for follow up after being started on Crestor.  He reports good compliance and no adverse or allergic reaction.   He denies any increase in muscle or joint pain since starting the medicine.   While talking with the patient we discussed the increase in his A1C from the last visit.  He had an A1C of 8.4 on 05/26/16 and his last A1C on 02/20/17 was 12.8.  He reports that he had not been taking his Invokana and Metformin correctly.   The patient is rather somber today and relates that he just received word that a good friend of his passed away this morning.     Patient Active Problem List   Diagnosis Date Noted  . Diabetes ([place]) 09/02/2014  . HLD (hyperlipidemia) 09/02/2014  . Adiposity 09/02/2014  . Obstructive apnea 09/02/2014  . Basal cell carcinoma of nose 01/02/2014    No past medical history on file.  Social History   Socioeconomic History  . Marital status: Divorced    Spouse name: Not on file  . Number of children: Not on file  . Years of education: Not on file  . Highest education level: Not on file  Social Needs  . Financial resource strain: Not on file  . Food insecurity - worry: Not on file  . Food insecurity - inability: Not on file  . Transportation needs - medical: Not on file  . Transportation needs - non-medical: Not on file  Occupational History  . Not on file  Tobacco Use  . Smoking status: Never Smoker  . Smokeless tobacco: Never Used  Substance and Sexual Activity  . Alcohol use: Yes    Comment: 1-2 drinks per month  . Drug use: No  . Sexual activity: Not on file  Other Topics Concern  . Not on file  Social History Narrative  . Not on file    Outpatient Encounter Medications as of 03/27/2017  Medication Sig  . acetaminophen (ACETAMINOPHEN 8 HOUR) 650 MG CR tablet Take 650 mg by mouth every 8 (eight) hours as needed for pain.  .  canagliflozin (INVOKANA) 300 MG TABS tablet Take 1 tablet (300 mg total) by mouth daily before breakfast.  . etanercept (ENBREL SURECLICK) 50 MG/ML injection Inject 50 mg into the skin once a week.  [name] glipiZIDE (GLUCOTROL) 5 MG tablet Take 1 tablet (5 mg total) by mouth daily before breakfast.  . metFORMIN (GLUCOPHAGE) 1000 MG tablet Take 1 tablet (1,000 mg total) by mouth 2 (two) times daily with a meal.  . naproxen (NAPROSYN) 500 MG tablet Take 1 tablet (500 mg total) by mouth 2 (two) times daily as needed.  . rosuvastatin (CRESTOR) 10 MG tablet Take 1 tablet (10 mg total) by mouth daily.  . sildenafil (REVATIO) 20 MG tablet Take 1 tablet (20 mg total) by mouth See admin instructions. 1-4 daily as needed  . tamsulosin (FLOMAX) 0.4 MG CAPS capsule Take 1 capsule (0.4 mg total) by mouth daily.  [name] triamcinolone cream (KENALOG) 0.1 % Apply 1 application topically 2 (two) times daily.   No facility-administered encounter medications on file as of 03/27/2017.     No Known Allergies  Review of Systems  Constitutional: Negative for fever and malaise/fatigue.  Eyes: Negative.   Respiratory: Negative for cough, shortness of breath and wheezing.   Cardiovascular: Negative for chest pain, palpitations, orthopnea, claudication  and leg swelling.  Gastrointestinal: Negative.   Genitourinary: Negative.   Musculoskeletal: Positive for joint pain.  Skin: Negative.   Neurological: Negative for dizziness, weakness and headaches.  Endo/Heme/Allergies: Negative.   Psychiatric/Behavioral: Negative.     Objective:  BP 138/80 (BP Location: Right Arm, Patient Position: Sitting, Cuff Size: Normal)   Pulse 70   Temp 97.6 F (36.4 C) (Oral)   Resp 16   Wt 283 lb (128.4 kg)   BMI 38.38 kg/m   Physical Exam  Constitutional: He is oriented to person, place, and time and well-developed, well-nourished, and in no distress.  HENT:  Head: Normocephalic and atraumatic.  Right Ear: External ear normal.  Left  Ear: External ear normal.  Nose: Nose normal.  Eyes: Conjunctivae are normal. No scleral icterus.  Neck: No thyromegaly present.  Cardiovascular: Normal rate, regular rhythm and normal heart sounds.  Pulmonary/Chest: Effort normal and breath sounds normal.  Abdominal: Soft.  Neurological: He is alert and oriented to person, place, and time. Gait normal. GCS score is 15.  Skin: Skin is warm and dry.  Psychiatric: Mood, memory, affect and judgment normal.    Assessment and Plan :  1. Pure hypercholesterolemia  - Lipid Panel With LDL/HDL Ratio - Comprehensive metabolic panel  2. Type 2 diabetes mellitus with diabetic neuropathy, without long-term current use of insulin (HCC) Pt noncompliant with meds and follow up. - gabapentin (NEURONTIN) 100 MG capsule; Take 1 capsule (100 mg total) by mouth 2 (two) times daily.  Dispense: 60 capsule; Refill: 12 3.Ankylosing Spondylitis 4.HLA B27 Arthropathy Per Rheumatology. 5.Obesity  I have done the exam and reviewed the chart and it is accurate to the best of my knowledge. Development worker, community has been used and  any errors in dictation or transcription are unintentional. Miguel Aschoff M.D. [facility]

## 2017-03-29 ENCOUNTER — Telehealth: Payer: Self-pay | Admitting: Family Medicine

## 2017-03-29 DIAGNOSIS — E119 Type 2 diabetes mellitus without complications: Secondary | ICD-10-CM

## 2017-03-29 MED ORDER — CANAGLIFLOZIN 300 MG PO TABS: 300.0000 mg | ORAL_TABLET | Freq: Every day | ORAL | 12 refills | Status: DC

## 2017-03-29 NOTE — Telephone Encounter (Signed)
Per lab note on 03/02/17 pt was to take invokana 300 mg daily and stay on it. Will sent in refill as pt says he does not have rx for this. Pt advised. Med sent.

## 2017-03-29 NOTE — Telephone Encounter (Signed)
Patient states that when he was in on 03/27/2017 on his after visit summary canagliflozin (INVOKANA) 300 MG TABS tablet is on his medication list.  He states that when he got home he noticed that he does not have this medication and has not been taking it.  He would like to know if he needs to be taking this and if so he uses [facility].

## 2017-04-20 ENCOUNTER — Other Ambulatory Visit: Payer: Self-pay | Admitting: Family Medicine

## 2017-04-20 DIAGNOSIS — E1140 Type 2 diabetes mellitus with diabetic neuropathy, unspecified: Secondary | ICD-10-CM

## 2017-04-20 MED ORDER — GABAPENTIN 100 MG PO CAPS: 100.0000 mg | ORAL_CAPSULE | Freq: Two times a day (BID) | ORAL | 12 refills | Status: DC

## 2017-04-20 NOTE — Telephone Encounter (Signed)
[facility] faxed a refill request for a 90-days supply for the following medication. Thanks CC  gabapentin (NEURONTIN) 100 MG capsule

## 2017-04-28 ENCOUNTER — Other Ambulatory Visit: Payer: Self-pay | Admitting: Family Medicine

## 2017-04-28 DIAGNOSIS — E1140 Type 2 diabetes mellitus with diabetic neuropathy, unspecified: Secondary | ICD-10-CM

## 2017-04-28 NOTE — Telephone Encounter (Signed)
[facility] faxed a refill request for a 90-days supply for the following medication. Thanks CC  gabapentin (NEURONTIN) 100 MG capsule

## 2017-05-01 MED ORDER — GABAPENTIN 100 MG PO CAPS: 100.0000 mg | ORAL_CAPSULE | Freq: Two times a day (BID) | ORAL | 12 refills | Status: DC

## 2017-05-18 ENCOUNTER — Telehealth: Payer: Self-pay

## 2017-05-18 NOTE — Telephone Encounter (Signed)
Ok thx.

## 2017-05-18 NOTE — Telephone Encounter (Signed)
Patient is requesting a refill on naproxen (NAPROSYN) 500 MG tablet be sent to [facility] pharmacy. CB# [phone]

## 2017-05-18 NOTE — Telephone Encounter (Signed)
Please advise. Thanks.  

## 2017-05-19 MED ORDER — NAPROXEN 500 MG PO TABS: 500.0000 mg | ORAL_TABLET | Freq: Two times a day (BID) | ORAL | 1 refills | Status: DC | PRN

## 2017-05-24 ENCOUNTER — Other Ambulatory Visit: Payer: Self-pay | Admitting: Family Medicine

## 2017-05-24 DIAGNOSIS — N529 Male erectile dysfunction, unspecified: Secondary | ICD-10-CM

## 2017-05-24 NOTE — Telephone Encounter (Signed)
Pt contacted office for refill request on the following medications:  sildenafil (REVATIO) 20 MG tablet   Pt would like to pick up hard script so he can find the cheapest way to get the medication.  Last Rx: 05/26/16 LOV: 03/27/17 NOV: 07/06/17 Please advise. Thanks TNP

## 2017-05-25 ENCOUNTER — Other Ambulatory Visit: Payer: Self-pay | Admitting: *Deleted

## 2017-05-25 DIAGNOSIS — N529 Male erectile dysfunction, unspecified: Secondary | ICD-10-CM

## 2017-05-25 MED ORDER — SILDENAFIL CITRATE 20 MG PO TABS: 20.0000 mg | ORAL_TABLET | ORAL | 11 refills | Status: DC

## 2017-05-25 NOTE — Telephone Encounter (Signed)
Patient is requesting a hard copy rx for sildenafil 20 mg. Patient did not want rx to be sent to pharmacy on 05/25/2017. Patient is trying to find the cheapest rx from an online pharmacy. Please advise?

## 2017-06-05 ENCOUNTER — Ambulatory Visit: Payer: Self-pay | Admitting: Family Medicine

## 2017-06-14 ENCOUNTER — Other Ambulatory Visit: Payer: Self-pay | Admitting: Family Medicine

## 2017-06-14 DIAGNOSIS — N529 Male erectile dysfunction, unspecified: Secondary | ICD-10-CM

## 2017-06-16 ENCOUNTER — Other Ambulatory Visit: Payer: Self-pay | Admitting: Family Medicine

## 2017-06-16 DIAGNOSIS — N529 Male erectile dysfunction, unspecified: Secondary | ICD-10-CM

## 2017-06-21 ENCOUNTER — Encounter: Payer: Self-pay | Admitting: Family Medicine

## 2017-07-06 ENCOUNTER — Ambulatory Visit: Payer: Self-pay | Admitting: Family Medicine

## 2017-07-12 ENCOUNTER — Other Ambulatory Visit: Payer: Self-pay | Admitting: Family Medicine

## 2017-08-27 ENCOUNTER — Other Ambulatory Visit: Payer: Self-pay | Admitting: Family Medicine

## 2017-08-27 DIAGNOSIS — E119 Type 2 diabetes mellitus without complications: Secondary | ICD-10-CM

## 2017-10-10 ENCOUNTER — Other Ambulatory Visit: Payer: Self-pay | Admitting: Family Medicine

## 2017-10-12 ENCOUNTER — Telehealth: Payer: Self-pay

## 2017-10-12 NOTE — Telephone Encounter (Signed)
Patient believes he has pink eye.He says since yesterday  his eyes have been blood shot red, itchy with drainage. We don't have any appointments today. Patient was offered an appointment for tomorrow morning with another PCP, but he preferred  that a message be sent to Dr. Rosanna Randy requesting a prescription for pink eye. He says he doesn't want to wait until tomorrow to be treated and have to deal with this over night. Please call patient back.

## 2017-10-12 NOTE — Telephone Encounter (Signed)
Advised patient that he needs to be seen. Patient would like to try something OTC first, then if not better he will come in.

## 2017-12-31 ENCOUNTER — Other Ambulatory Visit: Payer: Self-pay | Admitting: Family Medicine

## 2018-01-24 ENCOUNTER — Other Ambulatory Visit: Payer: Self-pay | Admitting: Family Medicine

## 2018-02-26 ENCOUNTER — Ambulatory Visit (INDEPENDENT_AMBULATORY_CARE_PROVIDER_SITE_OTHER): Payer: BLUE CROSS/BLUE SHIELD | Admitting: Family Medicine

## 2018-02-26 ENCOUNTER — Encounter: Payer: Self-pay | Admitting: Family Medicine

## 2018-02-26 VITALS — BP 132/70 | HR 76 | Temp 97.7°F | Resp 16 | Wt 269.2 lb

## 2018-02-26 DIAGNOSIS — L404 Guttate psoriasis: Secondary | ICD-10-CM | POA: Diagnosis not present

## 2018-02-26 DIAGNOSIS — E119 Type 2 diabetes mellitus without complications: Secondary | ICD-10-CM

## 2018-02-26 DIAGNOSIS — E7800 Pure hypercholesterolemia, unspecified: Secondary | ICD-10-CM | POA: Diagnosis not present

## 2018-02-26 DIAGNOSIS — J069 Acute upper respiratory infection, unspecified: Secondary | ICD-10-CM

## 2018-02-26 MED ORDER — ROSUVASTATIN CALCIUM 10 MG PO TABS: 10.0000 mg | ORAL_TABLET | Freq: Every day | ORAL | 3 refills | Status: DC

## 2018-02-26 MED ORDER — AMOXICILLIN 875 MG PO TABS: 875.0000 mg | ORAL_TABLET | Freq: Two times a day (BID) | ORAL | 0 refills | Status: DC

## 2018-02-26 MED ORDER — CANAGLIFLOZIN 300 MG PO TABS: 300.0000 mg | ORAL_TABLET | Freq: Every day | ORAL | 12 refills | Status: DC

## 2018-02-26 MED ORDER — HYDROCODONE-HOMATROPINE 5-1.5 MG/5ML PO SYRP: 5.0000 mL | ORAL_SOLUTION | Freq: Three times a day (TID) | ORAL | 0 refills | Status: DC | PRN

## 2018-02-26 NOTE — Progress Notes (Signed)
Patient: Raymond Michael Male    DOB: October 24, 1955   63 y.o.   MRN: [id number] Visit Date: 02/26/2018  Today's Provider: Vernie Murders, PA   Chief Complaint  Patient presents with  . URI   Subjective:     URI   This is a new problem. The current episode started yesterday. The problem has been gradually worsening. There has been no fever. Associated symptoms include congestion, coughing, rhinorrhea and sneezing. Pertinent negatives include no chest pain, ear pain, headaches, plugged ear sensation, sinus pain, sore throat or wheezing. Associated symptoms comments: "body ache" . He has tried NSAIDs and decongestant (IBU, OTC cough syrup) for the symptoms. The treatment provided no relief.   History reviewed. No pertinent past medical history. Patient Active Problem List   Diagnosis Date Noted  . Diabetes ([place]) 09/02/2014  . HLD (hyperlipidemia) 09/02/2014  . Adiposity 09/02/2014  . Obstructive apnea 09/02/2014  . Basal cell carcinoma of nose 01/02/2014   Past Surgical History:  Procedure Laterality Date  . NOSE SURGERY    . TONSILLECTOMY AND ADENOIDECTOMY     Family History  Problem Relation Age of Onset  . Diabetes Father   . Diabetes Brother    No Known Allergies  Current Outpatient Medications:  .  acetaminophen (ACETAMINOPHEN 8 HOUR) 650 MG CR tablet, Take 650 mg by mouth every 8 (eight) hours as needed for pain., Disp: , Rfl:  .  gabapentin (NEURONTIN) 100 MG capsule, Take 1 capsule (100 mg total) by mouth 2 (two) times daily., Disp: 60 capsule, Rfl: 12 .  glipiZIDE (GLUCOTROL) 5 MG tablet, TAKE 1 TABLET BY MOUTH ONCE DAILY BEFORE BREAKFAST, Disp: 30 tablet, Rfl: 12 .  metFORMIN (GLUCOPHAGE) 1000 MG tablet, TAKE 1 TABLET BY MOUTH TWICE DAILY WITH A MEAL, Disp: 60 tablet, Rfl: 12 .  naproxen (NAPROSYN) 500 MG tablet, TAKE 1 TABLET BY MOUTH TWICE A DAY AS NEEDED, Disp: 180 tablet, Rfl: 1 .  rosuvastatin (CRESTOR) 10 MG tablet, TAKE 1 TABLET BY MOUTH EVERY DAY, Disp:  90 tablet, Rfl: 3 .  triamcinolone cream (KENALOG) 0.1 %, Apply 1 application topically 2 (two) times daily., Disp: 30 g, Rfl: 0 .  canagliflozin (INVOKANA) 300 MG TABS tablet, Take 1 tablet (300 mg total) by mouth daily before breakfast. (Patient not taking: Reported on 02/26/2018), Disp: 30 tablet, Rfl: 12 .  etanercept (ENBREL SURECLICK) 50 MG/ML injection, Inject 50 mg into the skin once a week., Disp: , Rfl:  .  sildenafil (REVATIO) 20 MG tablet, TAKE 1 TO 4 TABLETS BY MOUTH DAILY AS NEEDED AS DIRECTED (Patient not taking: Reported on 02/26/2018), Disp: 30 tablet, Rfl: 19 .  tamsulosin (FLOMAX) 0.4 MG CAPS capsule, TAKE 1 CAPSULE BY MOUTH ONCE DAILY (Patient not taking: Reported on 02/26/2018), Disp: 90 capsule, Rfl: 3  Review of Systems  Constitutional: Positive for diaphoresis. Negative for fever.  HENT: Positive for congestion, rhinorrhea and sneezing. Negative for ear pain, sinus pressure, sinus pain and sore throat.   Respiratory: Positive for cough and chest tightness. Negative for shortness of breath and wheezing.   Cardiovascular: Negative for chest pain.  Neurological: Negative for headaches.   Social History   Tobacco Use  . Smoking status: Never Smoker  . Smokeless tobacco: Never Used  Substance Use Topics  . Alcohol use: Yes    Comment: 1-2 drinks per month     Objective:   BP 132/70 (BP Location: Right Arm, Patient Position: Sitting, Cuff Size: Large)  Pulse 76   Temp 97.7 F (36.5 C) (Oral)   Resp 16   Wt 269 lb 3.2 oz (122.1 kg)   SpO2 97% Comment: 97-96%  BMI 36.51 kg/m  Vitals:   02/26/18 1031  BP: 132/70  Pulse: 76  Resp: 16  Temp: 97.7 F (36.5 C)  TempSrc: Oral  SpO2: 97%  Weight: 269 lb 3.2 oz (122.1 kg)   Physical Exam Constitutional:      General: He is not in acute distress.    Appearance: He is well-developed.  HENT:     Head: Normocephalic and atraumatic.     Right Ear: Hearing normal.     Left Ear: Hearing normal.     Nose: Nose  normal.  Eyes:     General: Lids are normal. No scleral icterus.       Right eye: No discharge.        Left eye: No discharge.     Conjunctiva/sclera: Conjunctivae normal.  Neck:     Musculoskeletal: Neck supple.  Cardiovascular:     Rate and Rhythm: Normal rate and regular rhythm.     Pulses: Normal pulses.     Heart sounds: Normal heart sounds.  Pulmonary:     Effort: Pulmonary effort is normal. No respiratory distress.     Breath sounds: Normal breath sounds.  Abdominal:     General: Bowel sounds are normal.     Palpations: Abdomen is soft.  Musculoskeletal: Normal range of motion.  Skin:    Findings: Rash present. No lesion.     Comments: "Coin" lesion on lower back and abdomen. No tenderness or vesicles. Lesions are erythematous and crusting white scale.  Neurological:     Mental Status: He is alert and oriented to person, place, and time.  Psychiatric:        Speech: Speech normal.        Behavior: Behavior normal.        Thought Content: Thought content normal.       Assessment & Plan    1. URI with cough and congestion Cough started over the past 24 hours with some sore throat. No documented fever or sputum production. Some nasal congestion. With significant chronic diseases (diabetes and use of Enbrel), will treat with Hycodan and Amoxil. Check CBC and increase fluid intake. May use cough lozenges and  Advil prn. Recheck if no better in 5-7 days. - CBC with Differential/Platelet  2. Type 2 diabetes mellitus without complication, unspecified whether long term insulin use (HCC) Last Hgb A1C was over 12 in January of 2019. Has not been taking Invokana the past few months or checking FBS. Continues to take the Metformin 1000 mg BID with Glipizide 5 mg q am. Must work on diet and weight loss and restart Invokana. Will get follow up labs. Needs annual eye exam. - canagliflozin (INVOKANA) 300 MG TABS tablet; Take 1 tablet (300 mg total) by mouth daily before breakfast.   Dispense: 30 tablet; Refill: 12 - CBC with Differential/Platelet - Comprehensive metabolic panel - Hemoglobin A1c  3. Pure hypercholesterolemia Tolerating the Crestor 10 mg qd and trying to lose weight with low fat diet. Refill Crestor and get follow up labs. - rosuvastatin (CRESTOR) 10 MG tablet; Take 1 tablet (10 mg total) by mouth daily.  Dispense: 90 tablet; Refill: 3 - Comprehensive metabolic panel - Lipid panel - TSH  4. Psoriasis, guttate Guttate lesions with white scale approximately 1-1.5 cm in diameter across the lower back and abdomen. Patient  states these have been present since taking Enbrel (has not had any injections in 6-12 months. States dermatologist gave him a "spray" to use on them.      Vernie Murders, PA  [facility]

## 2018-03-15 ENCOUNTER — Telehealth: Payer: Self-pay

## 2018-03-15 ENCOUNTER — Other Ambulatory Visit: Payer: Self-pay

## 2018-03-15 MED ORDER — PIOGLITAZONE HCL 15 MG PO TABS: 15.0000 mg | ORAL_TABLET | Freq: Every day | ORAL | 12 refills | Status: DC

## 2018-03-15 NOTE — Telephone Encounter (Signed)
fonr

## 2018-03-15 NOTE — Telephone Encounter (Signed)
Try generic pioglitazone 15 mg every morning.  Stop Invokana

## 2018-03-15 NOTE — Telephone Encounter (Signed)
Patient states he went to get canagliflozin (INVOKANA) 300 MG TABS tablet and it was going to cost $500. He is requesting a call back today about this. I advised patient that the medication probably needed a PA, and he said he "thinks" the pharmacy sent a PA already. CB#[phone]

## 2018-03-15 NOTE — Telephone Encounter (Signed)
Called the pharmacy to verify if a PA was needed, and unfortunately, invokana will be over $500 with his insurance plan this year. A PA is not needed. Patient is requesting that we change to something different. Please review. Thanks!

## 2018-03-16 DIAGNOSIS — E7800 Pure hypercholesterolemia, unspecified: Secondary | ICD-10-CM | POA: Diagnosis not present

## 2018-03-16 DIAGNOSIS — J069 Acute upper respiratory infection, unspecified: Secondary | ICD-10-CM | POA: Diagnosis not present

## 2018-03-16 DIAGNOSIS — E119 Type 2 diabetes mellitus without complications: Secondary | ICD-10-CM | POA: Diagnosis not present

## 2018-03-17 LAB — COMPREHENSIVE METABOLIC PANEL
ALT: 59 IU/L — ABNORMAL HIGH (ref 0–44)
AST: 47 IU/L — ABNORMAL HIGH (ref 0–40)
Albumin/Globulin Ratio: 1.6 (ref 1.2–2.2)
Albumin: 4.5 g/dL (ref 3.8–4.8)
Alkaline Phosphatase: 72 IU/L (ref 39–117)
BUN/Creatinine Ratio: 15 (ref 10–24)
BUN: 12 mg/dL (ref 8–27)
Bilirubin Total: 0.5 mg/dL (ref 0.0–1.2)
CO2: 19 mmol/L — ABNORMAL LOW (ref 20–29)
Calcium: 9.4 mg/dL (ref 8.6–10.2)
Chloride: 99 mmol/L (ref 96–106)
Creatinine, Ser: 0.81 mg/dL (ref 0.76–1.27)
GFR calc Af Amer: 110 mL/min/{1.73_m2} (ref >59)
GFR calc non Af Amer: 95 mL/min/{1.73_m2} (ref >59)
Globulin, Total: 2.8 g/dL (ref 1.5–4.5)
Glucose: 264 mg/dL — ABNORMAL HIGH (ref 65–99)
Potassium: 4.4 mmol/L (ref 3.5–5.2)
Sodium: 137 mmol/L (ref 134–144)
Total Protein: 7.3 g/dL (ref 6.0–8.5)

## 2018-03-17 LAB — CBC WITH DIFFERENTIAL/PLATELET
Basophils Absolute: 0.1 10*3/uL (ref 0.0–0.2)
Basos: 1 %
EOS (ABSOLUTE): 0.2 10*3/uL (ref 0.0–0.4)
Eos: 4 %
Hematocrit: 43.7 % (ref 37.5–51.0)
Hemoglobin: 14.4 g/dL (ref 13.0–17.7)
Immature Grans (Abs): 0 10*3/uL (ref 0.0–0.1)
Immature Granulocytes: 0 %
Lymphocytes Absolute: 1.5 10*3/uL (ref 0.7–3.1)
Lymphs: 32 %
MCH: 29.7 pg (ref 26.6–33.0)
MCHC: 33 g/dL (ref 31.5–35.7)
MCV: 90 fL (ref 79–97)
Monocytes Absolute: 0.5 10*3/uL (ref 0.1–0.9)
Monocytes: 11 %
Neutrophils Absolute: 2.5 10*3/uL (ref 1.4–7.0)
Neutrophils: 52 %
Platelets: 213 10*3/uL (ref 150–450)
RBC: 4.85 x10E6/uL (ref 4.14–5.80)
RDW: 13.7 % (ref 11.6–15.4)
WBC: 4.8 10*3/uL (ref 3.4–10.8)

## 2018-03-17 LAB — LIPID PANEL
Chol/HDL Ratio: 2.6 ratio (ref 0.0–5.0)
Cholesterol, Total: 116 mg/dL (ref 100–199)
HDL: 44 mg/dL (ref >39)
LDL Calculated: 46 mg/dL (ref 0–99)
Triglycerides: 130 mg/dL (ref 0–149)
VLDL Cholesterol Cal: 26 mg/dL (ref 5–40)

## 2018-03-17 LAB — HEMOGLOBIN A1C
Est. average glucose Bld gHb Est-mCnc: 303 mg/dL
Hgb A1c MFr Bld: 12.2 % — ABNORMAL HIGH (ref 4.8–5.6)

## 2018-03-17 LAB — TSH: TSH: 5 u[IU]/mL — ABNORMAL HIGH (ref 0.450–4.500)

## 2018-05-11 ENCOUNTER — Other Ambulatory Visit: Payer: Self-pay | Admitting: *Deleted

## 2018-05-11 DIAGNOSIS — E1140 Type 2 diabetes mellitus with diabetic neuropathy, unspecified: Secondary | ICD-10-CM

## 2018-05-11 MED ORDER — GABAPENTIN 100 MG PO CAPS: 100.0000 mg | ORAL_CAPSULE | Freq: Two times a day (BID) | ORAL | 12 refills | Status: DC

## 2018-05-14 ENCOUNTER — Ambulatory Visit (INDEPENDENT_AMBULATORY_CARE_PROVIDER_SITE_OTHER): Payer: PRIVATE HEALTH INSURANCE | Admitting: Family Medicine

## 2018-05-14 ENCOUNTER — Other Ambulatory Visit: Payer: Self-pay

## 2018-05-14 ENCOUNTER — Encounter: Payer: Self-pay | Admitting: Family Medicine

## 2018-05-14 VITALS — BP 130/80 | HR 67 | Temp 98.0°F | Ht 73.0 in | Wt 269.2 lb

## 2018-05-14 DIAGNOSIS — E119 Type 2 diabetes mellitus without complications: Secondary | ICD-10-CM | POA: Diagnosis not present

## 2018-05-14 DIAGNOSIS — M1380 Other specified arthritis, unspecified site: Secondary | ICD-10-CM

## 2018-05-14 DIAGNOSIS — E7800 Pure hypercholesterolemia, unspecified: Secondary | ICD-10-CM

## 2018-05-14 DIAGNOSIS — L409 Psoriasis, unspecified: Secondary | ICD-10-CM

## 2018-05-14 DIAGNOSIS — Z6835 Body mass index (BMI) 35.0-35.9, adult: Secondary | ICD-10-CM

## 2018-05-14 DIAGNOSIS — G4733 Obstructive sleep apnea (adult) (pediatric): Secondary | ICD-10-CM

## 2018-05-14 MED ORDER — PREDNISONE 10 MG (48) PO TBPK: ORAL_TABLET | ORAL | 0 refills | Status: DC

## 2018-05-14 NOTE — Progress Notes (Signed)
Patient: Raymond Michael Male    DOB: 02-May-1955   63 y.o.   MRN: [id number] Visit Date: 05/14/2018  Today's Provider: Wilhemena Durie, MD   Chief Complaint  Patient presents with  . Hand Pain    right hand 05/04/18 severe but has beeen hurting for several months   . Foot Swelling    left foot for 1 month (feb)   Subjective:     Hand Pain   The incident occurred 2 days ago. The injury mechanism is unknown. The pain is present in the right hand and right wrist. The pain does not radiate. The pain is at a severity of 9/10. The pain is severe. The pain has been constant since the incident. The symptoms are aggravated by movement. He has tried ice for the symptoms. The treatment provided no relief.  Patient has not been taking his Enbrel for his seronegative arthritides. Pt reports he is having swelling in left foot and has no memory of an injury.  Pt feels that it is coming from his diabetes.  He is also complaining of a rash that he is developed over his entire trunk over the past several months.  He saw a dermatologist for it.  He does not Remer remember what it was called.  It is getting worse. He also has not checked his blood sugar in an several weeks.  I explained to him why this was important.  Last A1c was above 12.  He states he is taking his medications other than his Enbrel. No Known Allergies   Current Outpatient Medications:  .  gabapentin (NEURONTIN) 100 MG capsule, Take 1 capsule (100 mg total) by mouth 2 (two) times daily., Disp: 60 capsule, Rfl: 12 .  glipiZIDE (GLUCOTROL) 5 MG tablet, TAKE 1 TABLET BY MOUTH ONCE DAILY BEFORE BREAKFAST, Disp: 30 tablet, Rfl: 12 .  metFORMIN (GLUCOPHAGE) 1000 MG tablet, TAKE 1 TABLET BY MOUTH TWICE DAILY WITH A MEAL, Disp: 60 tablet, Rfl: 12 .  naproxen (NAPROSYN) 500 MG tablet, TAKE 1 TABLET BY MOUTH TWICE A DAY AS NEEDED, Disp: 180 tablet, Rfl: 1 .  pioglitazone (ACTOS) 15 MG tablet, Take 1 tablet (15 mg total) by mouth daily.,  Disp: 30 tablet, Rfl: 12 .  rosuvastatin (CRESTOR) 10 MG tablet, Take 1 tablet (10 mg total) by mouth daily., Disp: 90 tablet, Rfl: 3 .  sildenafil (REVATIO) 20 MG tablet, TAKE 1 TO 4 TABLETS BY MOUTH DAILY AS NEEDED AS DIRECTED, Disp: 30 tablet, Rfl: 19 .  etanercept (ENBREL SURECLICK) 50 MG/ML injection, Inject 50 mg into the skin once a week., Disp: , Rfl:  .  HYDROcodone-homatropine (HYCODAN) 5-1.5 MG/5ML syrup, Take 5 mLs by mouth every 8 (eight) hours as needed for cough. (Patient not taking: Reported on 05/14/2018), Disp: 120 mL, Rfl: 0 .  predniSONE (STERAPRED UNI-PAK 48 TAB) 10 MG (48) TBPK tablet, Take as directed for 12 days, Disp: 48 tablet, Rfl: 0 .  tamsulosin (FLOMAX) 0.4 MG CAPS capsule, TAKE 1 CAPSULE BY MOUTH ONCE DAILY (Patient not taking: Reported on 02/26/2018), Disp: 90 capsule, Rfl: 3 .  triamcinolone cream (KENALOG) 0.1 %, Apply 1 application topically 2 (two) times daily. (Patient not taking: Reported on 05/14/2018), Disp: 30 g, Rfl: 0  Review of Systems  Constitutional: Negative.   HENT: Negative.   Eyes: Negative.   Respiratory: Negative.   Cardiovascular: Positive for leg swelling (left foot).  Gastrointestinal: Negative.   Endocrine: Negative.   Genitourinary: Negative.  Musculoskeletal: Positive for arthralgias (right hand pain).  Skin: Negative.   Allergic/Immunologic: Negative.   Neurological: Negative.   Hematological: Negative.   Psychiatric/Behavioral: Negative.     Social History   Tobacco Use  . Smoking status: Never Smoker  . Smokeless tobacco: Never Used  Substance Use Topics  . Alcohol use: Yes    Comment: 1-2 drinks per month      Objective:   BP 130/80 (BP Location: Left Arm, Patient Position: Sitting, Cuff Size: Large)   Pulse 67   Temp 98 F (36.7 C) (Oral)   Ht 6\' 1"  (1.854 m)   Wt 269 lb 3.2 oz (122.1 kg)   SpO2 97%   BMI 35.52 kg/m  Vitals:   05/14/18 1040  BP: 130/80  Pulse: 67  Temp: 98 F (36.7 C)  TempSrc: Oral   SpO2: 97%  Weight: 269 lb 3.2 oz (122.1 kg)  Height: 6\' 1"  (1.854 m)     Physical Exam Constitutional:      Appearance: He is obese.  HENT:     Head: Normocephalic and atraumatic.     Right Ear: External ear normal.     Left Ear: External ear normal.     Nose: Nose normal.     Mouth/Throat:     Pharynx: Oropharynx is clear.  Eyes:     General: No scleral icterus.    Conjunctiva/sclera: Conjunctivae normal.  Cardiovascular:     Rate and Rhythm: Normal rate and regular rhythm.  Pulmonary:     Effort: Pulmonary effort is normal.     Breath sounds: Normal breath sounds.  Abdominal:     Palpations: Abdomen is soft.  Musculoskeletal:        General: Swelling present.     Comments: Some mild swelling of the right wrist.  Swelling of the left second toe.  He has swelling of the metatarsal pad under the second third and fourth toes and above him.  There is no tenderness of the toes or of the wrist.  There is mild tenderness around the tarsal pads and above those metatarsals.  No warmth at all in either area.    Skin:    General: Skin is warm and dry.     Findings: Rash present.     Comments: He has significant patches of scaling rash on his trunk.  At least severe eczema.  May be psoriatic.  Neurological:     Mental Status: He is alert.         Assessment & Plan    1. Seronegative arthritis With no trauma and no obvious sign of infection or gout will assume this is from his seronegative arthritis.  At this time despite his diabetes we will treat him with a prednisone 10 mg 12-day taper and refer him back to rheumatology.  He did not give me a reason that he stopped his Enbrel.  It was not due to side effects. - DG Wrist Complete Right; Future - DG Hand Complete Right; Future - DG Foot Complete Left; Future - predniSONE (STERAPRED UNI-PAK 48 TAB) 10 MG (48) TBPK tablet; Take as directed for 12 days  Dispense: 48 tablet; Refill: 0  2. Obstructive apnea Patient knows he needs  to wear CPAP.  3. Type 2 diabetes mellitus without complication, without long-term current use of insulin ([place]) Too early for A1c although last one was 12.  Told him to check his blood sugars daily as he can at least know how his lifestyle is affecting  his sugar.  Also to check it daily while he is on his prednisone taper.  I will plan to see him back in 1 to 2 months.  4. Pure hypercholesterolemia On statin.  5. Class 2 severe obesity due to excess calories with serious comorbidity and body mass index (BMI) of 35.0 to 35.9 in adult [facility]) With diabetes, hyperlipidemia, arthritis.  6. Psoriasis (a type of skin inflammation) Hopefully prednisone will help.  Patient has a appointment he thinks with dermatology next month.    I have done the exam and reviewed the above chart and it is accurate to the best of my knowledge. Development worker, community has been used in this note in any air is in the dictation or transcription are unintentional.  Wilhemena Durie, MD  [place]  HPI and API scribed in the presence of Dr Miguel Aschoff MD  Edd Arbour, [place]

## 2018-05-21 ENCOUNTER — Ambulatory Visit: Payer: Self-pay | Admitting: Family Medicine

## 2018-06-02 ENCOUNTER — Other Ambulatory Visit: Payer: Self-pay | Admitting: Family Medicine

## 2018-06-02 DIAGNOSIS — N529 Male erectile dysfunction, unspecified: Secondary | ICD-10-CM

## 2018-06-07 ENCOUNTER — Ambulatory Visit: Payer: Self-pay | Admitting: Family Medicine

## 2018-06-30 ENCOUNTER — Other Ambulatory Visit: Payer: Self-pay | Admitting: Family Medicine

## 2018-06-30 DIAGNOSIS — N529 Male erectile dysfunction, unspecified: Secondary | ICD-10-CM

## 2018-08-05 ENCOUNTER — Other Ambulatory Visit: Payer: Self-pay | Admitting: Family Medicine

## 2018-08-05 DIAGNOSIS — E119 Type 2 diabetes mellitus without complications: Secondary | ICD-10-CM

## 2018-08-16 ENCOUNTER — Other Ambulatory Visit: Payer: Self-pay | Admitting: Family Medicine

## 2018-08-16 DIAGNOSIS — N529 Male erectile dysfunction, unspecified: Secondary | ICD-10-CM

## 2018-09-21 ENCOUNTER — Other Ambulatory Visit: Payer: Self-pay | Admitting: Family Medicine

## 2018-09-21 DIAGNOSIS — N529 Male erectile dysfunction, unspecified: Secondary | ICD-10-CM

## 2018-11-12 ENCOUNTER — Other Ambulatory Visit: Payer: Self-pay | Admitting: Family Medicine

## 2019-01-09 ENCOUNTER — Other Ambulatory Visit: Payer: Self-pay | Admitting: Family Medicine

## 2019-01-09 DIAGNOSIS — N529 Male erectile dysfunction, unspecified: Secondary | ICD-10-CM

## 2019-01-20 ENCOUNTER — Other Ambulatory Visit: Payer: Self-pay | Admitting: Family Medicine

## 2019-01-20 NOTE — Telephone Encounter (Signed)
Requested medication (s) are due for refill today: yes  Requested medication (s) are on the active medication list: yes  Last refill: 11/13/2018  #60 1refill  Future visit scheduled: No  Per note of last visit patient due for visit  Notes to clinic:  Needs appointment    Requested Prescriptions  Pending Prescriptions Disp Refills   metFORMIN (GLUCOPHAGE) 1000 MG tablet [Pharmacy Med Name: metFORMIN HCl 1000 MG Oral Tablet] 120 tablet 0    Sig: TAKE 1 TABLET BY [place]     Endocrinology:  Diabetes - Biguanides Failed - 01/20/2019  6:30 AM      Failed - HBA1C is between 0 and 7.9 and within 180 days    Hgb A1c MFr Bld  Date Value Ref Range Status  03/16/2018 12.2 (H) 4.8 - 5.6 % Final    Comment:             Prediabetes: 5.7 - 6.4          Diabetes: >6.4          Glycemic control for adults with diabetes: <7.0          Failed - Valid encounter within last 6 months    Recent Outpatient Visits          8 months ago Seronegative arthritis   [place] Jerrol Banana., MD   10 months ago URI with cough and congestion   [place], [place], [place]   1 year ago Pure hypercholesterolemia   [facility] Jerrol Banana., MD   1 year ago Type 2 diabetes mellitus without complication, unspecified whether long term insulin use [facility])   [facility] Jerrol Banana., MD   2 years ago Type 2 diabetes mellitus without complication, unspecified long term insulin use status   [facility] Raymond Michael, Retia Passe., MD             Passed - Cr in normal range and within 360 days    Creatinine, Ser  Date Value Ref Range Status  03/16/2018 0.81 0.76 - 1.27 mg/dL Final         Passed - eGFR in normal range and within 360 days    GFR calc Af Amer  Date Value Ref Range Status  03/16/2018 110 >59 mL/min/1.73 Final   GFR calc non Af Amer  Date Value Ref Range Status   03/16/2018 95 >59 mL/min/1.73 Final

## 2019-02-17 ENCOUNTER — Other Ambulatory Visit: Payer: Self-pay | Admitting: Family Medicine

## 2019-02-17 DIAGNOSIS — E7800 Pure hypercholesterolemia, unspecified: Secondary | ICD-10-CM

## 2019-02-17 NOTE — Telephone Encounter (Signed)
Requested medication (s) are due for refill today: yes  Requested medication (s) are on the active medication list:  yes  Last refill:  11/17/2018  Future visit scheduled: no  Notes to clinic: Per last office visit patient is due for appointment.Last several appointments were canceled    Requested Prescriptions  Pending Prescriptions Disp Refills   rosuvastatin (CRESTOR) 10 MG tablet [Pharmacy Med Name: ROSUVASTATIN CALCIUM 10 MG TAB] 90 tablet 3    Sig: TAKE 1 TABLET BY MOUTH EVERY DAY      Cardiovascular:  Antilipid - Statins Passed - 02/17/2019  1:07 AM      Passed - Total Cholesterol in normal range and within 360 days    Cholesterol, Total  Date Value Ref Range Status  03/16/2018 116 100 - 199 mg/dL Final          Passed - LDL in normal range and within 360 days    LDL Calculated  Date Value Ref Range Status  03/16/2018 46 0 - 99 mg/dL Final          Passed - HDL in normal range and within 360 days    HDL  Date Value Ref Range Status  03/16/2018 44 >39 mg/dL Final          Passed - Triglycerides in normal range and within 360 days    Triglycerides  Date Value Ref Range Status  03/16/2018 130 0 - 149 mg/dL Final          Passed - Patient is not pregnant      Passed - Valid encounter within last 12 months    Recent Outpatient Visits           9 months ago Seronegative arthritis   [place] Jerrol Banana., MD   11 months ago URI with cough and congestion   [place], [place], [place]   1 year ago Pure hypercholesterolemia   [facility] Jerrol Banana., MD   1 year ago Type 2 diabetes mellitus without complication, unspecified whether long term insulin use [facility])   [facility] Jerrol Banana., MD   2 years ago Type 2 diabetes mellitus without complication, unspecified long term insulin use status   [facility] Jerrol Banana., MD

## 2019-02-18 ENCOUNTER — Other Ambulatory Visit: Payer: Self-pay | Admitting: Family Medicine

## 2019-02-18 NOTE — Telephone Encounter (Signed)
Requested medication (s) are due for refill today: no  Requested medication (s) are on the active medication list: yes  Last refill:  12/14/2018  Future visit scheduled: no  Notes to clinic:  no valid encounter within last 6 months   Requested Prescriptions  Pending Prescriptions Disp Refills   pioglitazone (ACTOS) 15 MG tablet [Pharmacy Med Name: PIOGLITAZONE HCL 15 MG TABLET] 90 tablet 4    Sig: TAKE 1 TABLET BY MOUTH EVERY DAY      Endocrinology:  Diabetes - Glitazones - pioglitazone Failed - 02/18/2019  1:15 AM      Failed - HBA1C is between 0 and 7.9 and within 180 days    Hgb A1c MFr Bld  Date Value Ref Range Status  03/16/2018 12.2 (H) 4.8 - 5.6 % Final    Comment:             Prediabetes: 5.7 - 6.4          Diabetes: >6.4          Glycemic control for adults with diabetes: <7.0           Failed - Valid encounter within last 6 months    Recent Outpatient Visits           9 months ago Seronegative arthritis   [place] Jerrol Banana., MD   11 months ago URI with cough and congestion   [facility] [place], [place], [place]   1 year ago Pure hypercholesterolemia   [facility] Jerrol Banana., MD   1 year ago Type 2 diabetes mellitus without complication, unspecified whether long term insulin use [facility])   [facility] Jerrol Banana., MD   2 years ago Type 2 diabetes mellitus without complication, unspecified long term insulin use status   [facility] Jerrol Banana., MD

## 2019-03-07 ENCOUNTER — Other Ambulatory Visit: Payer: Self-pay | Admitting: Family Medicine

## 2019-03-07 DIAGNOSIS — N529 Male erectile dysfunction, unspecified: Secondary | ICD-10-CM

## 2019-03-30 ENCOUNTER — Other Ambulatory Visit: Payer: Self-pay | Admitting: Family Medicine

## 2019-04-02 ENCOUNTER — Other Ambulatory Visit: Payer: Self-pay | Admitting: Family Medicine

## 2019-04-02 ENCOUNTER — Telehealth: Payer: Self-pay | Admitting: Family Medicine

## 2019-04-02 NOTE — Telephone Encounter (Signed)
Requested medication (s) are due for refill today: yes  Requested medication (s) are on the active medication list: yes  Last refill: 01/20/2019  Future visit scheduled: yes  04/08/19  Notes to clinic:  Patient is out of medication Please bridge until appointment.     Requested Prescriptions  Pending Prescriptions Disp Refills   metFORMIN (GLUCOPHAGE) 1000 MG tablet [Pharmacy Med Name: metFORMIN HCl 1000 MG Oral Tablet] 120 tablet 0    Sig: TAKE 1 TABLET BY MOUTH TWICE DAILY WITH MEALS . APPOINTMENT REQUIRED FOR FUTURE REFILLS      Endocrinology:  Diabetes - Biguanides Failed - 04/02/2019  9:12 AM      Failed - Cr in normal range and within 360 days    Creatinine, Ser  Date Value Ref Range Status  03/16/2018 0.81 0.76 - 1.27 mg/dL Final          Failed - HBA1C is between 0 and 7.9 and within 180 days    Hgb A1c MFr Bld  Date Value Ref Range Status  03/16/2018 12.2 (H) 4.8 - 5.6 % Final    Comment:             Prediabetes: 5.7 - 6.4          Diabetes: >6.4          Glycemic control for adults with diabetes: <7.0           Failed - eGFR in normal range and within 360 days    GFR calc Af Amer  Date Value Ref Range Status  03/16/2018 110 >59 mL/min/1.73 Final   GFR calc non Af Amer  Date Value Ref Range Status  03/16/2018 95 >59 mL/min/1.73 Final          Failed - Valid encounter within last 6 months    Recent Outpatient Visits           10 months ago Seronegative arthritis   [facility] Jerrol Banana., MD   1 year ago URI with cough and congestion   [place], [place], [place]   2 years ago Pure hypercholesterolemia   [facility] Jerrol Banana., MD   2 years ago Type 2 diabetes mellitus without complication, unspecified whether long term insulin use [facility])   [facility] Jerrol Banana., MD   2 years ago Type 2 diabetes mellitus without complication, unspecified long term  insulin use status   [facility] Jerrol Banana., MD       Future Appointments             In 6 days Jerrol Banana., MD [facility], [place]

## 2019-04-02 NOTE — Telephone Encounter (Signed)
Called patient and scheduled him for appointment for medication management.

## 2019-04-03 ENCOUNTER — Other Ambulatory Visit: Payer: Self-pay | Admitting: Family Medicine

## 2019-04-03 NOTE — Progress Notes (Signed)
Patient: Raymond Michael Male    DOB: October 16, 1955   64 y.o.   MRN: [id number] Visit Date: 04/08/2019  Today's Provider: Wilhemena Durie, MD   Chief Complaint  Patient presents with  . Diabetes Mellitus   Subjective:     HPI  Noncompliant patient comes in today for follow-up because he needed meds refilled.  He states he feels well.  He had Covid in early January and has recovered completely.  He states he has been working on diet and exercise the past few months. Type 2 diabetes mellitus without complication, without long-term current use of insulin ([place]) From 05/14/2018-Too early for A1c although last one 03/16/2018 was 12.2.  advised to check his blood sugars daily as he can at least know how his lifestyle is affecting his sugar.  Seronegative arthritis From 05/14/2018-x-rays obtained of right wrist, right hand, and left foot. Given rx for prednisone 10 mg 12-day taper and referred back to rheumatology.  He did not give me a reason that he stopped his Enbrel.  It was not due to side effects.  Obstructive apnea From 05/14/2018-Patient knows he needs to wear CPAP.  Pure hypercholesterolemia Labs last checked 03/16/2018-lipid panel was normal. On statin.  Class 2 severe obesity due to excess calories with serious comorbidity and body mass index (BMI) of 35.0 to 35.9 in adult [facility]) From 05/14/2018-With diabetes, hyperlipidemia, arthritis.   No Known Allergies   Current Outpatient Medications:  .  Canagliflozin-metFORMIN HCl [phone] MG TABS, Take by mouth., Disp: , Rfl:  .  etanercept (ENBREL SURECLICK) 50 MG/ML injection, Inject 50 mg into the skin once a week., Disp: , Rfl:  .  gabapentin (NEURONTIN) 100 MG capsule, Take 1 capsule (100 mg total) by mouth 2 (two) times daily., Disp: 60 capsule, Rfl: 12 .  glipiZIDE (GLUCOTROL) 5 MG tablet, TAKE 1 TABLET BY MOUTH ONCE DAILY BEFORE BREAKFAST, Disp: 90 tablet, Rfl: 3 .  HYDROcodone-homatropine (HYCODAN) 5-1.5 MG/5ML syrup,  Take 5 mLs by mouth every 8 (eight) hours as needed for cough. (Patient not taking: Reported on 05/14/2018), Disp: 120 mL, Rfl: 0 .  metFORMIN (GLUCOPHAGE) 1000 MG tablet, TAKE 1 TABLET BY MOUTH TWICE DAILY WITH MEALS . APPOINTMENT REQUIRED FOR FUTURE REFILLS, Disp: 120 tablet, Rfl: 0 .  naproxen (NAPROSYN) 500 MG tablet, TAKE 1 TABLET BY MOUTH TWICE A DAY AS NEEDED, Disp: 180 tablet, Rfl: 1 .  pioglitazone (ACTOS) 15 MG tablet, TAKE 1 TABLET BY MOUTH EVERY DAY, Disp: 90 tablet, Rfl: 0 .  predniSONE (STERAPRED UNI-PAK 48 TAB) 10 MG (48) TBPK tablet, Take as directed for 12 days, Disp: 48 tablet, Rfl: 0 .  rosuvastatin (CRESTOR) 10 MG tablet, TAKE 1 TABLET BY MOUTH EVERY DAY, Disp: 90 tablet, Rfl: 3 .  sildenafil (REVATIO) 20 MG tablet, TAKE 1 TO 4 TABLETS BY MOUTH ONCE DAILY AS NEEDED, Disp: 50 tablet, Rfl: 0 .  tamsulosin (FLOMAX) 0.4 MG CAPS capsule, TAKE 1 CAPSULE BY MOUTH ONCE DAILY (Patient not taking: Reported on 02/26/2018), Disp: 90 capsule, Rfl: 3 .  triamcinolone cream (KENALOG) 0.1 %, Apply 1 application topically 2 (two) times daily. (Patient not taking: Reported on 05/14/2018), Disp: 30 g, Rfl: 0  Review of Systems  Constitutional: Negative for appetite change, chills and fever.  Eyes: Negative.   Respiratory: Negative for chest tightness, shortness of breath and wheezing.   Cardiovascular: Negative for chest pain and palpitations.  Gastrointestinal: Negative for abdominal pain, nausea and vomiting.  Endocrine: Negative.  Musculoskeletal: Positive for arthralgias.  Allergic/Immunologic: Negative.   Neurological: Negative.   Psychiatric/Behavioral: Negative.     Social History   Tobacco Use  . Smoking status: Never Smoker  . Smokeless tobacco: Never Used  Substance Use Topics  . Alcohol use: Yes    Comment: 1-2 drinks per month      Objective:   There were no vitals taken for this visit. There were no vitals filed for this visit.There is no height or weight on file to  calculate BMI.   Physical Exam Vitals reviewed.  Constitutional:      Appearance: He is obese.  HENT:     Head: Normocephalic and atraumatic.     Right Ear: External ear normal.     Left Ear: External ear normal.     Nose: Nose normal.     Mouth/Throat:     Pharynx: Oropharynx is clear.  Eyes:     General: No scleral icterus.    Conjunctiva/sclera: Conjunctivae normal.  Cardiovascular:     Rate and Rhythm: Normal rate and regular rhythm.  Pulmonary:     Effort: Pulmonary effort is normal.     Breath sounds: Normal breath sounds.  Abdominal:     Palpations: Abdomen is soft.  Skin:    General: Skin is warm and dry.  Neurological:     Mental Status: He is alert.      No results found for any visits on 04/08/19.     Assessment & Plan    1. Type 2 diabetes mellitus without complication, without long-term current use of insulin (H[facility]) A1c 10.9 which is improved from last year.  Patient instructed to work on diet and exercise.  No changes in his medications.  I will see him back in 3 months. - POCT HgB A1C  2. Obstructive apnea On CPAP  3. Pure hypercholesterolemia On Crestor.  Obtain full lab panel next visit.  4. Class 2 severe obesity due to excess calories with serious comorbidity and body mass index (BMI) of 35.0 to 35.9 in adult [facility]) With diabetes hyperlipidemia  5. Seronegative arthritis/HLA-B27 associated arthritis He has been dismissed by rheumatology due to his lack of compliance  6. Noncompliance He is noncompliant.  Told him was not given prescriptions for more than 6 months. Unfortunately, at baseline he is also rude to staff.     Richard Cranford Mon, MD  [facility]

## 2019-04-08 ENCOUNTER — Other Ambulatory Visit: Payer: Self-pay

## 2019-04-08 ENCOUNTER — Encounter: Payer: Self-pay | Admitting: Family Medicine

## 2019-04-08 ENCOUNTER — Ambulatory Visit (INDEPENDENT_AMBULATORY_CARE_PROVIDER_SITE_OTHER): Payer: PRIVATE HEALTH INSURANCE | Admitting: Family Medicine

## 2019-04-08 VITALS — BP 129/83 | HR 73 | Temp 95.9°F | Ht 73.0 in | Wt 280.6 lb

## 2019-04-08 DIAGNOSIS — E7800 Pure hypercholesterolemia, unspecified: Secondary | ICD-10-CM | POA: Diagnosis not present

## 2019-04-08 DIAGNOSIS — M1380 Other specified arthritis, unspecified site: Secondary | ICD-10-CM

## 2019-04-08 DIAGNOSIS — E119 Type 2 diabetes mellitus without complications: Secondary | ICD-10-CM | POA: Diagnosis not present

## 2019-04-08 DIAGNOSIS — Z6835 Body mass index (BMI) 35.0-35.9, adult: Secondary | ICD-10-CM

## 2019-04-08 DIAGNOSIS — G4733 Obstructive sleep apnea (adult) (pediatric): Secondary | ICD-10-CM | POA: Diagnosis not present

## 2019-04-08 DIAGNOSIS — Z91199 Patient's noncompliance with other medical treatment and regimen due to unspecified reason: Secondary | ICD-10-CM

## 2019-04-08 DIAGNOSIS — Z9119 Patient's noncompliance with other medical treatment and regimen: Secondary | ICD-10-CM

## 2019-04-08 LAB — POCT GLYCOSYLATED HEMOGLOBIN (HGB A1C)
Estimated Average Glucose: 266
Hemoglobin A1C: 10.9 % — AB (ref 4.0–5.6)

## 2019-04-13 ENCOUNTER — Other Ambulatory Visit: Payer: Self-pay | Admitting: Family Medicine

## 2019-04-13 DIAGNOSIS — N529 Male erectile dysfunction, unspecified: Secondary | ICD-10-CM

## 2019-05-13 ENCOUNTER — Other Ambulatory Visit: Payer: Self-pay | Admitting: Family Medicine

## 2019-05-29 ENCOUNTER — Other Ambulatory Visit: Payer: Self-pay | Admitting: Family Medicine

## 2019-05-29 DIAGNOSIS — N529 Male erectile dysfunction, unspecified: Secondary | ICD-10-CM

## 2019-05-29 NOTE — Telephone Encounter (Signed)
Requested Prescriptions  Pending Prescriptions Disp Refills  . sildenafil (REVATIO) 20 MG tablet [Pharmacy Med Name: Sildenafil Citrate 20 MG Oral Tablet] 50 tablet 0    Sig: TAKE 1 TO 4 TABLETS BY MOUTH ONCE DAILY AS NEEDED     Urology: Erectile Dysfunction Agents Passed - 05/29/2019 12:24 PM      Passed - Last BP in normal range    BP Readings from Last 1 Encounters:  04/08/19 129/83         Passed - Valid encounter within last 12 months    Recent Outpatient Visits          1 month ago Type 2 diabetes mellitus without complication, without long-term current use of insulin [facility])   [facility] Jerrol Banana., MD   1 year ago Seronegative arthritis   [facility] Jerrol Banana., MD   1 year ago URI with cough and congestion   [place], [place], [place]   2 years ago Pure hypercholesterolemia   [facility] Jerrol Banana., MD   2 years ago Type 2 diabetes mellitus without complication, unspecified whether long term insulin use [facility])   [facility] Jerrol Banana., MD      Future Appointments            In 2 months Jerrol Banana., MD [facility], [place]

## 2019-06-07 ENCOUNTER — Other Ambulatory Visit: Payer: Self-pay | Admitting: Family Medicine

## 2019-06-07 DIAGNOSIS — E1140 Type 2 diabetes mellitus with diabetic neuropathy, unspecified: Secondary | ICD-10-CM

## 2019-06-18 ENCOUNTER — Other Ambulatory Visit: Payer: Self-pay | Admitting: Family Medicine

## 2019-07-22 ENCOUNTER — Other Ambulatory Visit: Payer: Self-pay | Admitting: Family Medicine

## 2019-07-22 DIAGNOSIS — N529 Male erectile dysfunction, unspecified: Secondary | ICD-10-CM

## 2019-08-06 ENCOUNTER — Other Ambulatory Visit: Payer: Self-pay | Admitting: Family Medicine

## 2019-08-06 ENCOUNTER — Ambulatory Visit: Payer: Self-pay | Admitting: Family Medicine

## 2019-08-06 DIAGNOSIS — E119 Type 2 diabetes mellitus without complications: Secondary | ICD-10-CM

## 2019-08-06 NOTE — Telephone Encounter (Signed)
Requested Prescriptions  Pending Prescriptions Disp Refills  . glipiZIDE (GLUCOTROL) 5 MG tablet [Pharmacy Med Name: GLIPIZIDE 5 MG TABLET] 90 tablet 1    Sig: TAKE 1 TABLET BY MOUTH ONCE DAILY BEFORE BREAKFAST     Endocrinology:  Diabetes - Sulfonylureas Failed - 08/06/2019  2:03 AM      Failed - HBA1C is between 0 and 7.9 and within 180 days    Hemoglobin A1C  Date Value Ref Range Status  04/08/2019 10.9 (A) 4.0 - 5.6 % Final   Hgb A1c MFr Bld  Date Value Ref Range Status  03/16/2018 12.2 (H) 4.8 - 5.6 % Final    Comment:             Prediabetes: 5.7 - 6.4          Diabetes: >6.4          Glycemic control for adults with diabetes: <7.0          Passed - Valid encounter within last 6 months    Recent Outpatient Visits          4 months ago Type 2 diabetes mellitus without complication, without long-term current use of insulin [facility])   [facility] Jerrol Banana., MD   1 year ago Seronegative arthritis   [facility] Jerrol Banana., MD   1 year ago URI with cough and congestion   [place], [place], [place]   2 years ago Pure hypercholesterolemia   [facility] Jerrol Banana., MD   2 years ago Type 2 diabetes mellitus without complication, unspecified whether long term insulin use [facility])   [facility] Jerrol Banana., MD      Future Appointments            In 1 month Jerrol Banana., MD [facility], [place]

## 2019-08-10 ENCOUNTER — Other Ambulatory Visit: Payer: Self-pay | Admitting: Family Medicine

## 2019-08-10 DIAGNOSIS — E1140 Type 2 diabetes mellitus with diabetic neuropathy, unspecified: Secondary | ICD-10-CM

## 2019-08-10 NOTE — Telephone Encounter (Signed)
Requested Prescriptions  Pending Prescriptions Disp Refills  . gabapentin (NEURONTIN) 100 MG capsule [Pharmacy Med Name: Gabapentin 100 MG Oral Capsule] 180 capsule 2    Sig: Take 1 capsule by mouth twice daily     Neurology: Anticonvulsants - gabapentin Passed - 08/10/2019  8:26 AM      Passed - Valid encounter within last 12 months    Recent Outpatient Visits          4 months ago Type 2 diabetes mellitus without complication, without long-term current use of insulin [facility])   [facility] Jerrol Banana., MD   1 year ago Seronegative arthritis   [facility] Jerrol Banana., MD   1 year ago URI with cough and congestion   [place], [place], [place]   2 years ago Pure hypercholesterolemia   [facility] Jerrol Banana., MD   2 years ago Type 2 diabetes mellitus without complication, unspecified whether long term insulin use [facility])   [facility] Jerrol Banana., MD      Future Appointments            In 3 weeks Jerrol Banana., MD [facility]

## 2019-08-14 ENCOUNTER — Other Ambulatory Visit: Payer: Self-pay | Admitting: Family Medicine

## 2019-08-14 NOTE — Telephone Encounter (Signed)
Requested Prescriptions  Pending Prescriptions Disp Refills  . pioglitazone (ACTOS) 15 MG tablet [Pharmacy Med Name: PIOGLITAZONE HCL 15 MG TABLET] 90 tablet 0    Sig: TAKE 1 TABLET BY MOUTH EVERY DAY     Endocrinology:  Diabetes - Glitazones - pioglitazone Failed - 08/14/2019  1:16 AM      Failed - HBA1C is between 0 and 7.9 and within 180 days    Hemoglobin A1C  Date Value Ref Range Status  04/08/2019 10.9 (A) 4.0 - 5.6 % Final   Hgb A1c MFr Bld  Date Value Ref Range Status  03/16/2018 12.2 (H) 4.8 - 5.6 % Final    Comment:             Prediabetes: 5.7 - 6.4          Diabetes: >6.4          Glycemic control for adults with diabetes: <7.0          Passed - Valid encounter within last 6 months    Recent Outpatient Visits          4 months ago Type 2 diabetes mellitus without complication, without long-term current use of insulin [facility])   [facility] Jerrol Banana[facility] MD   1 year ago Seronegative arthritis   [facility] Jerrol Banana., MD   1 year ago URI with cough and congestion   [place], [place], [place]   2 years ago Pure hypercholesterolemia   [facility] Jerrol Banana., MD   2 years ago Type 2 diabetes mellitus without complication, unspecified whether long term insulin use [facility])   [facility] Jerrol Banana., MD      Future Appointments            In 3 weeks Jerrol Banana., MD [facility], [place]

## 2019-09-05 ENCOUNTER — Ambulatory Visit: Payer: Self-pay | Admitting: Family Medicine

## 2019-09-12 ENCOUNTER — Other Ambulatory Visit: Payer: Self-pay | Admitting: Family Medicine

## 2019-09-12 NOTE — Telephone Encounter (Signed)
Requested  medications are  due for refill today yes  Requested medications are on the active medication list yes  Last refill 2/17  Last visit 03/2019  Future visit scheduled NO  Notes to clinic already had a curtesy refill, office note states come back in 3 months and has not and no visit scheduled.

## 2019-09-26 ENCOUNTER — Other Ambulatory Visit: Payer: Self-pay | Admitting: Family Medicine

## 2019-09-26 DIAGNOSIS — N529 Male erectile dysfunction, unspecified: Secondary | ICD-10-CM

## 2019-10-07 DIAGNOSIS — E119 Type 2 diabetes mellitus without complications: Secondary | ICD-10-CM | POA: Insufficient documentation

## 2019-10-08 DIAGNOSIS — N529 Male erectile dysfunction, unspecified: Secondary | ICD-10-CM | POA: Insufficient documentation

## 2019-10-29 ENCOUNTER — Other Ambulatory Visit: Payer: Self-pay | Admitting: Family Medicine

## 2019-10-29 DIAGNOSIS — E119 Type 2 diabetes mellitus without complications: Secondary | ICD-10-CM

## 2019-10-29 NOTE — Telephone Encounter (Signed)
Requested Prescriptions  Pending Prescriptions Disp Refills  . glipiZIDE (GLUCOTROL) 5 MG tablet [Pharmacy Med Name: GLIPIZIDE 5 MG TABLET] 30 tablet 0    Sig: TAKE 1 TABLET BY MOUTH ONCE DAILY BEFORE BREAKFAST     Endocrinology:  Diabetes - Sulfonylureas Failed - 10/29/2019  1:24 AM      Failed - HBA1C is between 0 and 7.9 and within 180 days    Hemoglobin A1C  Date Value Ref Range Status  04/08/2019 10.9 (A) 4.0 - 5.6 % Final   Hgb A1c MFr Bld  Date Value Ref Range Status  03/16/2018 12.2 (H) 4.8 - 5.6 % Final    Comment:             Prediabetes: 5.7 - 6.4          Diabetes: >6.4          Glycemic control for adults with diabetes: <7.0          Failed - Valid encounter within last 6 months    Recent Outpatient Visits          6 months ago Type 2 diabetes mellitus without complication, without long-term current use of insulin [facility])   [facility] Jerrol Banana., MD   1 year ago Seronegative arthritis   [facility] Jerrol Banana., MD   1 year ago URI with cough and congestion   [place], [place], [place]   2 years ago Pure hypercholesterolemia   [facility] Jerrol Banana., MD   2 years ago Type 2 diabetes mellitus without complication, unspecified whether long term insulin use [facility])   [facility] Jerrol Banana., MD             One month courtesy refill.  Reminder for patient - Patient needs to schedule an appointment with provider for 6 month follow-up.

## 2019-11-21 ENCOUNTER — Other Ambulatory Visit: Payer: Self-pay | Admitting: Family Medicine

## 2019-11-22 ENCOUNTER — Other Ambulatory Visit: Payer: Self-pay | Admitting: Family Medicine

## 2019-11-22 DIAGNOSIS — E119 Type 2 diabetes mellitus without complications: Secondary | ICD-10-CM

## 2019-11-22 NOTE — Telephone Encounter (Signed)
Courtesy refill. Called patient to set up appt . No answer. Left message to call back.

## 2019-12-17 ENCOUNTER — Other Ambulatory Visit: Payer: Self-pay | Admitting: Family Medicine

## 2019-12-17 DIAGNOSIS — E119 Type 2 diabetes mellitus without complications: Secondary | ICD-10-CM

## 2019-12-18 ENCOUNTER — Other Ambulatory Visit: Payer: Self-pay | Admitting: Family Medicine

## 2019-12-18 DIAGNOSIS — E119 Type 2 diabetes mellitus without complications: Secondary | ICD-10-CM

## 2019-12-24 ENCOUNTER — Other Ambulatory Visit: Payer: Self-pay | Admitting: Family Medicine

## 2019-12-24 DIAGNOSIS — N529 Male erectile dysfunction, unspecified: Secondary | ICD-10-CM

## 2020-01-13 ENCOUNTER — Other Ambulatory Visit: Payer: Self-pay | Admitting: Family Medicine

## 2020-01-13 DIAGNOSIS — E7800 Pure hypercholesterolemia, unspecified: Secondary | ICD-10-CM

## 2020-01-13 DIAGNOSIS — E119 Type 2 diabetes mellitus without complications: Secondary | ICD-10-CM

## 2020-01-13 NOTE — Telephone Encounter (Signed)
Patient called and advised he was supposed to be seen in the office 2-3 months after his last visit and an appointment is needed for medication refills. He says he will call back once his has his calendar. He says he's ok with his medications at this time.

## 2020-02-27 ENCOUNTER — Other Ambulatory Visit: Payer: Self-pay | Admitting: Family Medicine

## 2020-02-27 DIAGNOSIS — E7800 Pure hypercholesterolemia, unspecified: Secondary | ICD-10-CM

## 2020-03-27 ENCOUNTER — Encounter: Payer: Self-pay | Admitting: Urology

## 2020-03-27 ENCOUNTER — Telehealth: Payer: Self-pay | Admitting: Urology

## 2020-03-27 ENCOUNTER — Ambulatory Visit (INDEPENDENT_AMBULATORY_CARE_PROVIDER_SITE_OTHER): Payer: 59 | Admitting: Urology

## 2020-03-27 ENCOUNTER — Other Ambulatory Visit: Payer: Self-pay

## 2020-03-27 VITALS — BP 143/88 | HR 72 | Ht 73.0 in | Wt 275.0 lb

## 2020-03-27 DIAGNOSIS — N5201 Erectile dysfunction due to arterial insufficiency: Secondary | ICD-10-CM

## 2020-03-27 MED ORDER — SILDENAFIL CITRATE 100 MG PO TABS: 100.0000 mg | ORAL_TABLET | Freq: Every day | ORAL | 3 refills | Status: DC | PRN

## 2020-03-27 NOTE — Telephone Encounter (Signed)
Please let patient know that [facility] office shockwave therapy for ED for $2400. It consist of six treatments at $400 per session   Will place a referral if he desires to proceed

## 2020-03-27 NOTE — Progress Notes (Signed)
03/27/2020 12:48 PM   Raymond Michael April 15, 1955 [id number]  Referring provider: Mechele Claude, FNP [address] [place],  [place] [address]  Chief Complaint  Patient presents with  . Erectile Dysfunction    HPI: 65 y.o. male referred for erectile dysfunction.   1 year history of ED  Partial erections typically not from left penetration  Has taken with sildenafil at 100 mg and tadalafil at 20 mg with improvement but erections still not from need for successful intercourse  Organic risk factors diabetes and hyperlipidemia  No previous tobacco history  No pain or curvature with erections  Has good libido and no significant tiredness or fatigue  BPH on tamsulosin   PMH: Past Medical History:  Diagnosis Date  . Diabetes ([place])   . Hyperlipidemia     Surgical History: Past Surgical History:  Procedure Laterality Date  . NOSE SURGERY    . TONSILLECTOMY AND ADENOIDECTOMY      Home Medications:  Allergies as of 03/27/2020   No Known Allergies     Medication List       Accurate as of March 27, 2020 12:48 PM. If you have any questions, ask your nurse or doctor.        Canagliflozin-metFORMIN HCl [phone] MG Tabs Take by mouth.   etanercept 50 MG/ML injection Commonly known as: ENBREL Inject 50 mg into the skin once a week.   gabapentin 100 MG capsule Commonly known as: NEURONTIN Take 1 capsule by mouth twice daily   glipiZIDE 5 MG tablet Commonly known as: GLUCOTROL TAKE 1 TABLET BY MOUTH ONCE DAILY BEFORE BREAKFAST   HYDROcodone-homatropine 5-1.5 MG/5ML syrup Commonly known as: HYCODAN Take 5 mLs by mouth every 8 (eight) hours as needed for cough.   metFORMIN 1000 MG tablet Commonly known as: GLUCOPHAGE TAKE 1 TABLET BY MOUTH TWICE DAILY WITH MEALS . APPOINTMENT REQUIRED FOR FUTURE REFILLS   naproxen 500 MG tablet Commonly known as: NAPROSYN TAKE 1 TABLET BY MOUTH TWICE A DAY AS NEEDED   pioglitazone 15 MG tablet Commonly known as:  ACTOS TAKE 1 TABLET BY MOUTH EVERY DAY   predniSONE 10 MG (48) Tbpk tablet Commonly known as: STERAPRED UNI-PAK 48 TAB Take as directed for 12 days   rosuvastatin 10 MG tablet Commonly known as: CRESTOR TAKE 1 TABLET BY MOUTH EVERY DAY   sildenafil 20 MG tablet Commonly known as: REVATIO TAKE 1 TO 4 TABLET(S) BY MOUTH ONCE DAILY AS NEEDED   tamsulosin 0.4 MG Caps capsule Commonly known as: FLOMAX TAKE 1 CAPSULE BY MOUTH ONCE DAILY   triamcinolone 0.1 % Commonly known as: KENALOG Apply 1 application topically 2 (two) times daily.       Allergies: No Known Allergies  Family History: Family History  Problem Relation Age of Onset  . Diabetes Father   . Diabetes Brother     Social History:  reports that he has never smoked. He has never used smokeless tobacco. He reports current alcohol use. He reports that he does not use drugs.   Physical Exam: BP (!) 143/88   Pulse 72   Ht 6\' 1"  (1.854 m)   Wt 275 lb (124.7 kg)   BMI 36.28 kg/m   Constitutional:  Alert and oriented, No acute distress. HEENT: [place] AT, moist mucus membranes.  Trachea midline, no masses. Cardiovascular: No clubbing, cyanosis, or edema. Respiratory: Normal respiratory effort, no increased work of breathing. GI: Abdomen is soft, nontender, nondistended, no abdominal masses GU: Phallus circumcised without lesions, testes  descended bilateral without masses or tenderness. Volume 20 cc bilaterally Skin: No rashes, bruises or suspicious lesions. Neurologic: Grossly intact, no focal deficits, moving all 4 extremities. Psychiatric: Normal mood and affect.   Assessment & Plan:    1. Erectile dysfunction  PDE5 refractory  Second line options were discussed including intracavernosal injections and vacuum erection devices  Penile implant surgery was also discussed  He was not interested in pursuing any of these options however has done some research on shockwave therapy and would be interested in  pursuing  I did inform him this treatment is available at [facility] and would obtain information on cost  He did request a refill on sildenafil   Abbie Sons, MD  [place] [address] [place], [place] [address] [phone]

## 2020-03-29 ENCOUNTER — Encounter: Payer: Self-pay | Admitting: Urology

## 2020-03-30 ENCOUNTER — Encounter: Payer: Self-pay | Admitting: *Deleted

## 2020-03-30 NOTE — Telephone Encounter (Signed)
Notified patient as instructed, patient would like this message from Dr. Bernardo Heater sent to his my chart. Sent 03/30/2020 to my chart

## 2020-04-23 ENCOUNTER — Other Ambulatory Visit: Payer: Self-pay | Admitting: Family Medicine

## 2020-04-23 DIAGNOSIS — N529 Male erectile dysfunction, unspecified: Secondary | ICD-10-CM

## 2020-04-23 NOTE — Telephone Encounter (Signed)
Requested medication (s) are due for refill today: no  Requested medication (s) are on the active medication list: yes  Last refill:  12/24/2019  Future visit scheduled: no  Notes to clinic: [place] 04/08/2019 Message has been sent to patient to contact office for appt  Review for courtesy refill   Requested Prescriptions  Pending Prescriptions Disp Refills   sildenafil (REVATIO) 20 MG tablet [Pharmacy Med Name: Sildenafil Citrate 20 MG Oral Tablet] 50 tablet 0    Sig: TAKE 1 TO 4 TABLETS BY MOUTH ONCE DAILY AS NEEDED      Urology: Erectile Dysfunction Agents Failed - 04/23/2020  5:30 AM      Failed - Last BP in normal range    BP Readings from Last 1 Encounters:  03/27/20 (!) 143/88          Failed - Valid encounter within last 12 months    Recent Outpatient Visits           1 year ago Type 2 diabetes mellitus without complication, without long-term current use of insulin [facility])   [facility] Jerrol Banana., MD   1 year ago Seronegative arthritis   [facility] Jerrol Banana., MD   2 years ago URI with cough and congestion   [place], [place], [place]   3 years ago Pure hypercholesterolemia   [facility] Jerrol Banana., MD   3 years ago Type 2 diabetes mellitus without complication, unspecified whether long term insulin use [facility])   [facility] Jerrol Banana., MD

## 2020-05-23 ENCOUNTER — Other Ambulatory Visit: Payer: Self-pay | Admitting: Family Medicine

## 2020-05-23 DIAGNOSIS — E1140 Type 2 diabetes mellitus with diabetic neuropathy, unspecified: Secondary | ICD-10-CM

## 2020-05-23 NOTE — Telephone Encounter (Signed)
Past due for appointment. MyChart message sent to pt to make appointment.  Last RF: 08/10/19 #180 2 RF  RF due.

## 2020-05-27 ENCOUNTER — Other Ambulatory Visit: Payer: Self-pay | Admitting: Family Medicine

## 2020-05-27 DIAGNOSIS — E1140 Type 2 diabetes mellitus with diabetic neuropathy, unspecified: Secondary | ICD-10-CM

## 2020-09-30 ENCOUNTER — Ambulatory Visit (INDEPENDENT_AMBULATORY_CARE_PROVIDER_SITE_OTHER): Payer: 59 | Admitting: Otolaryngology

## 2020-11-04 NOTE — Progress Notes (Signed)
11/06/2020 10:19 AM   Raymond Michael 1955/04/30 [id number]  Referring provider: Mechele Claude, FNP [address] [place],  [place] [address]  Chief Complaint  Patient presents with   Follow-up    Urinary Hesitancy    HPI: 65 year old male with a personal history of erectile dysfunction who presents today for second opinion.  He was seen and evaluated in February by my partner for erectile dysfunction secondary to poor erections not satisfactory for penetration.  He is tried and failed sildenafil 100 mg and tadalafil 200 mg.  He reports that he is needle phobic and is unwilling to try suppositories or injections.  He also has a personal history of BPH.  His chart indicates that he is on Flomax however he does not think that he is taking this medication.  He denies taking a capsule.  He is not sure he ever took the medication or whether or not it was helpful for him.    He is primarily here today to discuss urinary hesitancy.  IPSS as below.  He complains of urgency frequency.  PVR today is 31 cc.  He was referred over to [facility]rology for consideration of shockwave penile therapy.  He did not pursue this due to cost.  He recently signed up for an online physician to be considered for testosterone therapy.  He denies any issues with energy, libido, or any other symptoms related to hypogonadism.     IPSS     Row Name 11/06/20 0900         International Prostate Symptom Score   How often have you had the sensation of not emptying your bladder? Not at All     How often have you had to urinate less than every two hours? Less than 1 in 5 times     How often have you found you stopped and started again several times when you urinated? Less than half the time     How often have you found it difficult to postpone urination? More than half the time     How often have you had a weak urinary stream? About half the time     How often have you had to strain to start urination? Not at  All     How many times did you typically get up at night to urinate? 2 Times     Total IPSS Score 12           Quality of Life due to urinary symptoms   If you were to spend the rest of your life with your urinary condition just the way it is now how would you feel about that? Mixed              Score:  1-7 Mild 8-19 Moderate 20-35 Severe   PMH: Past Medical History:  Diagnosis Date   Diabetes ([place])    Hyperlipidemia     Surgical History: Past Surgical History:  Procedure Laterality Date   NOSE SURGERY     TONSILLECTOMY AND ADENOIDECTOMY      Home Medications:  Allergies as of 11/06/2020   No Known Allergies      Medication List        Accurate as of November 06, 2020 10:19 AM. If you have any questions, ask your nurse or doctor.          STOP taking these medications    HYDROcodone-homatropine 5-1.5 MG/5ML syrup Commonly known as: HYCODAN Stopped by: Hollice Espy, MD  predniSONE 10 MG (48) Tbpk tablet Commonly known as: STERAPRED UNI-PAK 48 TAB Stopped by: Hollice Espy, MD   sildenafil 20 MG tablet Commonly known as: REVATIO Stopped by: Hollice Espy, MD       TAKE these medications    Canagliflozin-metFORMIN HCl [phone] MG Tabs Take by mouth.   etanercept 50 MG/ML injection Commonly known as: ENBREL Inject 50 mg into the skin once a week.   gabapentin 100 MG capsule Commonly known as: NEURONTIN Take 1 capsule by mouth twice daily   glipiZIDE 5 MG tablet Commonly known as: GLUCOTROL TAKE 1 TABLET BY MOUTH ONCE DAILY BEFORE BREAKFAST   metFORMIN 1000 MG tablet Commonly known as: GLUCOPHAGE TAKE 1 TABLET BY MOUTH TWICE DAILY WITH MEALS . APPOINTMENT REQUIRED FOR FUTURE REFILLS   naproxen 500 MG tablet Commonly known as: NAPROSYN TAKE 1 TABLET BY MOUTH TWICE A DAY AS NEEDED   pioglitazone 15 MG tablet Commonly known as: ACTOS TAKE 1 TABLET BY MOUTH EVERY DAY   rosuvastatin 10 MG tablet Commonly known as:  CRESTOR TAKE 1 TABLET BY MOUTH EVERY DAY   sildenafil 100 MG tablet Commonly known as: VIAGRA Take 1 tablet (100 mg total) by mouth daily as needed for erectile dysfunction. Take one tab one hour prior to intercourse   tamsulosin 0.4 MG Caps capsule Commonly known as: FLOMAX TAKE 1 CAPSULE BY MOUTH ONCE DAILY   triamcinolone cream 0.1 % Commonly known as: KENALOG Apply 1 application topically 2 (two) times daily.        Allergies: No Known Allergies  Family History: Family History  Problem Relation Age of Onset   Diabetes Father    Diabetes Brother     Social History:  reports that he has never smoked. He has never used smokeless tobacco. He reports current alcohol use. He reports that he does not use drugs.   Physical Exam: BP (!) 170/97   Pulse 61   Ht 6\' 1"  (1.854 m)   Wt 287 lb (130.2 kg)   BMI 37.87 kg/m   Constitutional:  Alert and oriented, No acute distress. HEENT: [place] AT, moist mucus membranes.  Trachea midline, no masses. Cardiovascular: No clubbing, cyanosis, or edema. Respiratory: Normal respiratory effort, no increased work of breathing. GI: Abdomen obese Rectal: Normal sphincter tone.  Mildly enlarged, only able to palpate the apex of the prostate due to habitus. Skin: No rashes, bruises or suspicious lesions. Neurologic: Grossly intact, no focal deficits, moving all 4 extremities. Psychiatric: Normal mood and affect.  Laboratory Data: Lab Results  Component Value Date   WBC 4.8 03/16/2018   HGB 14.4 03/16/2018   HCT 43.7 03/16/2018   MCV 90 03/16/2018   PLT 213 03/16/2018    Lab Results  Component Value Date   CREATININE 0.81 03/16/2018    Lab Results  Component Value Date   HGBA1C 10.9 (A) 04/08/2019    Urinalysis Results for orders placed or performed in visit on 11/06/20  BLADDER SCAN AMB NON-IMAGING  Result Value Ref Range   Scan Result 58ml      Assessment & Plan:    1. Benign prostatic hyperplasia (BPH) with urinary  urgency Poorly controlled urinary symptoms which are primarily storage related  PSA screening updated today along with rectal exam and PSA which is pending  He is unsure whether he is taking Flomax for despite being on his list but denies taking it currently.  As such, we will try him on this medication again and reassess his symptoms.  We discussed possible side effects including dizziness with standing and retrograde ejaculation amongst others.  Reassess symptoms in 3 months with IPSS/PVR. - PSA; Future - Testosterone; Future  2. Erectile dysfunction due to arterial insufficiency We had conversation again today about his various treatment options as he had with Dr. Bernardo Heater.  He did not follow through with penile shockwave due to concern for cost.  He has failed PDE 5 inhibitors.  He is refused intracavernosal injections and suppositories which were discussed at length today.  He is also not interested in VED or penile prosthesis which are also discussed.  He is frustrated that he does not have any additional options other than the above.  We discussed the role of hypogonadism and what it may be contributing to his erectile function.  We discussed that he does not have any signs or symptoms of hypogonadism other than his erectile dysfunction which may be a blood flow issue more so than a hormonal issue.  We will check a testosterone today to rule this out.  - BLADDER SCAN AMB NON-IMAGING - PSA; Future - Testosterone; Future  F/u 3 months with IPSS/ PVR, will call with lab results   Hollice Espy, MD  [place] [address], [place], [place] [address] [phone]  I spent 42 total minutes on the day of the encounter including pre-visit review of the medical record, face-to-face time with the patient, and post visit ordering of labs/imaging/tests.  The pressure of the time was spent face-to-face with with the patient, 30 minutes answering  all of his questions.  The remainder of the time was spent reviewing previous records and charting.

## 2020-11-05 ENCOUNTER — Other Ambulatory Visit: Payer: Self-pay | Admitting: *Deleted

## 2020-11-05 DIAGNOSIS — N5201 Erectile dysfunction due to arterial insufficiency: Secondary | ICD-10-CM

## 2020-11-06 ENCOUNTER — Other Ambulatory Visit
Admission: RE | Admit: 2020-11-06 | Discharge: 2020-11-06 | Disposition: A | Discharge status: Home / Self Care | Payer: 59 | Attending: Urology | Admitting: Urology

## 2020-11-06 ENCOUNTER — Encounter: Payer: Self-pay | Admitting: Urology

## 2020-11-06 ENCOUNTER — Other Ambulatory Visit: Payer: Self-pay

## 2020-11-06 ENCOUNTER — Ambulatory Visit (INDEPENDENT_AMBULATORY_CARE_PROVIDER_SITE_OTHER): Payer: 59 | Admitting: Urology

## 2020-11-06 VITALS — BP 170/97 | HR 61 | Ht 73.0 in | Wt 287.0 lb

## 2020-11-06 DIAGNOSIS — R3915 Urgency of urination: Secondary | ICD-10-CM | POA: Insufficient documentation

## 2020-11-06 DIAGNOSIS — N5201 Erectile dysfunction due to arterial insufficiency: Secondary | ICD-10-CM | POA: Diagnosis not present

## 2020-11-06 DIAGNOSIS — N401 Enlarged prostate with lower urinary tract symptoms: Secondary | ICD-10-CM | POA: Insufficient documentation

## 2020-11-06 LAB — URINALYSIS, COMPLETE (UACMP) WITH MICROSCOPIC
Bilirubin Urine: NEGATIVE
Glucose, UA: NEGATIVE mg/dL
Hgb urine dipstick: NEGATIVE
Leukocytes,Ua: NEGATIVE
Nitrite: NEGATIVE
Protein, ur: 30 mg/dL — AB
Specific Gravity, Urine: >1.03 — ABNORMAL HIGH (ref 1.005–1.030)
pH: 5 (ref 5.0–8.0)

## 2020-11-06 LAB — PSA: Prostatic Specific Antigen: 93.51 ng/mL — ABNORMAL HIGH (ref 0.00–4.00)

## 2020-11-06 LAB — BLADDER SCAN AMB NON-IMAGING

## 2020-11-07 LAB — TESTOSTERONE: Testosterone: 509 ng/dL (ref 264–916)

## 2020-11-09 ENCOUNTER — Telehealth: Payer: Self-pay | Admitting: *Deleted

## 2020-11-09 NOTE — Telephone Encounter (Addendum)
Patient informed, reviewed instructions in detail. Scheduled biopsy-attempted to schedule results appointment patient will be out of town and will schedule follow up at time of biopsy. Mailed instructions and reminder letter.    ----- Message from Hollice Espy, MD sent at 11/09/2020 12:24 PM EDT ----- This patient's testosterone is normal which is reassuring however his PSA is markedly elevated 93.  This is very highly concerning for prostate cancer.  I believe he needs a prostate biopsy.  Please review risk with him including risk of bleeding infection and discomfort.  Either offer him a virtual inpatient visit to discuss or if he is willing to just go ahead and book a prostate biopsy, lets proceed ASAP.  Hollice Espy, MD

## 2020-11-12 MED ORDER — TAMSULOSIN HCL 0.4 MG PO CAPS: 0.4000 mg | ORAL_CAPSULE | Freq: Every day | ORAL | 3 refills | Status: DC

## 2020-11-12 NOTE — Telephone Encounter (Addendum)
Patient called concerned with proceeding with prostate biopsy first vs ordering an MRI. He states "Other friends have had an elevated PSA and the MRI was ordered first, I think it's backwards"  I explained to the patient from conversation regarding the PSA labs from 11/09/20 the need for biopsy and Dr. Cherrie Gauze concerns. He was adamant about getting MRI done prior. He would like a return call to explain further. He is aware I will inform Dr. Erlene Quan and someone will return call. Biopsy scheduled 11/18/20

## 2020-11-12 NOTE — Addendum Note (Signed)
Addended by: Verlene Mayer A on: 11/12/2020 12:05 PM   Modules accepted: Orders

## 2020-11-12 NOTE — Telephone Encounter (Signed)
Spoke with patient, scheduled a visit in office-declined virtual appointment.

## 2020-11-12 NOTE — Telephone Encounter (Signed)
Please make him a virtual visit next week to discuss this further.  It sounds like he has a lot of questions and this was a relatively unexpected finding.  I like to have the chance to discuss this with him in detail and answered all of his questions.  Hollice Espy, MD

## 2020-11-16 NOTE — Progress Notes (Signed)
11/17/20 3:17 PM   Raymond Michael 18-Apr-1955 [id number]  Referring provider:  Jerrol Banana., MD [address] [place],  [place] [address] Chief Complaint  Patient presents with   Elevated PSA     HPI: Raymond Michael is a 65 y.o.male with a personal history of erectile dysfunction and BPH with urinary urgency, who presents today to discuss prostate biopsy.   He was seen and evaluated in February by my partner for erectile dysfunction secondary to poor erections not satisfactory for penetration.  He is tried and failed sildenafil 100 mg and tadalafil 200 mg  Recent PSA on 11/06/2020 showed a markedly elevated PSA of 93.51; concerning for prostate cancer.   He is doing well today and has no new urinary symptoms. All questions regarding prostate biopsy were answered.    PMH: Past Medical History:  Diagnosis Date   Diabetes ([place])    Hyperlipidemia     Surgical History: Past Surgical History:  Procedure Laterality Date   NOSE SURGERY     TONSILLECTOMY AND ADENOIDECTOMY      Home Medications:  Allergies as of 11/17/2020   No Known Allergies      Medication List        Accurate as of November 17, 2020  3:17 PM. If you have any questions, ask your nurse or doctor.          Canagliflozin-metFORMIN HCl [phone] MG Tabs Take by mouth.   etanercept 50 MG/ML injection Commonly known as: ENBREL Inject 50 mg into the skin once a week.   gabapentin 100 MG capsule Commonly known as: NEURONTIN Take 1 capsule by mouth twice daily   glipiZIDE 5 MG tablet Commonly known as: GLUCOTROL TAKE 1 TABLET BY MOUTH ONCE DAILY BEFORE BREAKFAST   metFORMIN 1000 MG tablet Commonly known as: GLUCOPHAGE TAKE 1 TABLET BY MOUTH TWICE DAILY WITH MEALS . APPOINTMENT REQUIRED FOR FUTURE REFILLS   naproxen 500 MG tablet Commonly known as: NAPROSYN TAKE 1 TABLET BY MOUTH TWICE A DAY AS NEEDED   pioglitazone 15 MG tablet Commonly known as: ACTOS TAKE 1 TABLET BY  MOUTH EVERY DAY   rosuvastatin 10 MG tablet Commonly known as: CRESTOR TAKE 1 TABLET BY MOUTH EVERY DAY   Rybelsus 7 MG Tabs Generic drug: Semaglutide PLEASE SEE ATTACHED FOR DETAILED DIRECTIONS   sildenafil 100 MG tablet Commonly known as: VIAGRA Take 1 tablet (100 mg total) by mouth daily as needed for erectile dysfunction. Take one tab one hour prior to intercourse   tamsulosin 0.4 MG Caps capsule Commonly known as: FLOMAX Take 1 capsule (0.4 mg total) by mouth daily.   triamcinolone cream 0.1 % Commonly known as: KENALOG Apply 1 application topically 2 (two) times daily.   Xeljanz 5 MG Tabs Generic drug: Tofacitinib Citrate Take 1 tablet by mouth 2 (two) times daily.        Allergies: No Known Allergies  Family History: Family History  Problem Relation Age of Onset   Diabetes Father    Diabetes Brother     Social History:  reports that he has never smoked. He has never used smokeless tobacco. He reports current alcohol use. He reports that he does not use drugs.   Physical Exam: BP 138/78   Pulse 82   Ht 6\' 1"  (1.854 m)   Wt 280 lb (127 kg)   BMI 36.94 kg/m   Constitutional:  Alert and oriented, No acute distress. HEENT: [place] AT, moist mucus membranes.  Trachea  midline, no masses. Cardiovascular: No clubbing, cyanosis, or edema. Respiratory: Normal respiratory effort, no increased work of breathing. Skin: No rashes, bruises or suspicious lesions. Neurologic: Grossly intact, no focal deficits, moving all 4 extremities. Psychiatric: Normal mood and affect.   Assessment & Plan:    Elevated PSA  - We reviewed the implications of an elevated PSA and the uncertainty surrounding it. In general, a man's PSA increases with age and is produced by both normal and cancerous prostate tissue. Differential for elevated PSA is BPH, prostate cancer, infection, recent intercourse/ejaculation, prostate infarction, recent urethroscopic manipulation (foley  placement/cystoscopy) and prostatitis. Management of an elevated PSA can include observation or prostate biopsy and wediscussed this in detail. We discussed that indications for prostate biopsy are defined by age and race specific PSA cutoffs as well as a PSA velocity of 0.75/year.  - We discussed prostate biopsy in detail including the procedure itself, the risks of blood in the urine, stool, and ejaculate, serious infection, and discomfort. He is willing to proceed with this as discussed.  -He has questions today about why we are not pursuing prostate MRI first.  I explained that with his PSA as high as it is, if there is prostate cancer, the indicated studies would likely be CT abdomen pelvis as well as bone scan.  I discussed how these imaging studies could be sequenced in the most cost effective approach.  He understands as well to proceed with biopsy.  I,Kailey Littlejohn,acting as a Education administrator for Hollice Espy, MD.,have documented all relevant documentation on the behalf of Hollice Espy, MD,as directed by  Hollice Espy, MD while in the presence of Hollice Espy, MD.  I have reviewed the above documentation for accuracy and completeness, and I agree with the above.   Hollice Espy, MD  [facility] [address] [place], [place] [address] [phone]

## 2020-11-17 ENCOUNTER — Other Ambulatory Visit: Payer: Self-pay

## 2020-11-17 ENCOUNTER — Ambulatory Visit (INDEPENDENT_AMBULATORY_CARE_PROVIDER_SITE_OTHER): Payer: 59 | Admitting: Urology

## 2020-11-17 VITALS — BP 138/78 | HR 82 | Ht 73.0 in | Wt 280.0 lb

## 2020-11-17 DIAGNOSIS — R972 Elevated prostate specific antigen [PSA]: Secondary | ICD-10-CM | POA: Diagnosis not present

## 2020-11-18 ENCOUNTER — Ambulatory Visit (INDEPENDENT_AMBULATORY_CARE_PROVIDER_SITE_OTHER): Payer: 59 | Admitting: Urology

## 2020-11-18 VITALS — BP 150/85 | HR 85 | Ht 73.0 in | Wt 280.0 lb

## 2020-11-18 DIAGNOSIS — Z298 Encounter for other specified prophylactic measures: Secondary | ICD-10-CM | POA: Diagnosis not present

## 2020-11-18 DIAGNOSIS — C61 Malignant neoplasm of prostate: Secondary | ICD-10-CM

## 2020-11-18 DIAGNOSIS — R972 Elevated prostate specific antigen [PSA]: Secondary | ICD-10-CM

## 2020-11-18 MED ORDER — GENTAMICIN SULFATE 40 MG/ML IJ SOLN
80.0000 mg | Freq: Once | INTRAMUSCULAR | Status: AC
  Administered 2020-11-18: 80 mg via INTRAMUSCULAR

## 2020-11-18 MED ORDER — LEVOFLOXACIN 500 MG PO TABS
500.0000 mg | ORAL_TABLET | Freq: Once | ORAL | Status: AC
  Administered 2020-11-18: 500 mg via ORAL

## 2020-11-18 NOTE — Progress Notes (Signed)
   11/18/20  CC:  Chief Complaint  Patient presents with   Prostate Biopsy      HPI: Raymond Michael is a 65 y.o. male with a personal history of erectile dysfunction and BPH with urinary urgency, who presents today for prostate biopsy.   Recent PSA on 11/06/2020 showed a markedly elevated PSA of 93.51.   He is doing well today with no new urinary symptoms. He reports that he had a PS drawn earlier that was over 100 and is concerned as to why his PCP did not follow-up on this.     Vitals:   11/18/20 1436  BP: (!) 150/85  Pulse: 85  NED. A&Ox3.   No respiratory distress   Abd soft, NT, ND Normal external genitalia with patent urethral meatus  Prostate Biopsy Procedure   Informed consent was obtained after discussing risks/benefits of the procedure.  A time out was performed to ensure correct patient identity.  Pre-Procedure: - Last PSA Level:  Component     Latest Ref Rng & Units 11/06/2020  Prostatic Specific Antigen     0.00 - 4.00 ng/mL 93.51 (H)   - Gentamicin given prophylactically - Levaquin 500 mg administered PO -Transrectal Ultrasound performed revealing a 87.6 gm prostate -No significant hypoechoic or median lobe noted   Procedure: - Prostate block performed using 10 cc 1% lidocaine and biopsies taken from sextant areas, a total of 12 under ultrasound guidance. - slightly irregular area at right apex that was biopsied   Post-Procedure: - Patient tolerated the procedure well - He was counseled to seek immediate medical attention if experiences any severe pain, significant bleeding, or fevers  Return in one week to discuss biopsy results  I have reviewed the above documentation for accuracy and completeness, and I agree with the above.   Hollice Espy, MD

## 2020-11-20 ENCOUNTER — Telehealth: Payer: Self-pay | Admitting: *Deleted

## 2020-11-20 DIAGNOSIS — C61 Malignant neoplasm of prostate: Secondary | ICD-10-CM

## 2020-11-20 LAB — SURGICAL PATHOLOGY

## 2020-11-20 NOTE — Telephone Encounter (Addendum)
Patient informed, voiced understanding.  Ordered CT and Bone scan   ----- Message from Hollice Espy, MD sent at 11/20/2020  4:11 PM EDT ----- Unsurprisingly, the prostate biopsy came back consistent with prostate cancer.  In order to help facilitate our discussion about various treatment options, I would like him to go ahead and get a CT abdomen pelvis with contrast as well as a bone scan.  Since his follow-up has been pushed out for 2 weeks, ideally this could be done before his follow-up visit.  Selena Batten, can you work with Sharyn Lull to try to expedite his staging.  Hollice Espy, MD'

## 2020-11-23 ENCOUNTER — Telehealth: Payer: Self-pay | Admitting: Urology

## 2020-11-23 NOTE — Telephone Encounter (Signed)
Is in town and would like to go ahead and set up scans for ct/mri. Would like a call back.

## 2020-11-23 NOTE — Addendum Note (Signed)
Addended by: Tommy Rainwater on: 11/23/2020 09:47 AM   Modules accepted: Orders

## 2020-11-24 ENCOUNTER — Encounter
Admission: RE | Admit: 2020-11-24 | Discharge: 2020-11-24 | Disposition: A | Discharge status: Home / Self Care | Payer: 59 | Source: Ambulatory Visit | Attending: Urology | Admitting: Urology

## 2020-11-24 ENCOUNTER — Ambulatory Visit: Payer: PRIVATE HEALTH INSURANCE | Admitting: Urology

## 2020-11-24 ENCOUNTER — Ambulatory Visit
Admission: RE | Admit: 2020-11-24 | Discharge: 2020-11-24 | Disposition: A | Discharge status: Home / Self Care | Payer: 59 | Source: Ambulatory Visit | Attending: Urology | Admitting: Urology

## 2020-11-24 ENCOUNTER — Telehealth: Payer: Self-pay

## 2020-11-24 ENCOUNTER — Telehealth: Payer: Self-pay | Admitting: Urology

## 2020-11-24 ENCOUNTER — Other Ambulatory Visit: Payer: Self-pay

## 2020-11-24 DIAGNOSIS — C61 Malignant neoplasm of prostate: Secondary | ICD-10-CM | POA: Diagnosis not present

## 2020-11-24 LAB — POCT I-STAT CREATININE: Creatinine, Ser: 0.8 mg/dL (ref 0.61–1.24)

## 2020-11-24 MED ORDER — IOHEXOL 350 MG/ML SOLN
75.0000 mL | Freq: Once | INTRAVENOUS | Status: AC | PRN
  Administered 2020-11-24: 75 mL via INTRAVENOUS

## 2020-11-24 MED ORDER — TECHNETIUM TC 99M MEDRONATE IV KIT
20.0000 | PACK | Freq: Once | INTRAVENOUS | Status: AC | PRN
  Administered 2020-11-24: 22 via INTRAVENOUS

## 2020-11-24 NOTE — Telephone Encounter (Signed)
Paxon left a vm to please call him w/o a reason. Stated that he called yesterday.

## 2020-11-24 NOTE — Telephone Encounter (Signed)
-----   Message from Hollice Espy, MD sent at 11/24/2020  4:26 PM EDT ----- Please let Mr. Ortez know that despite his PSA being so elevated, his CT scan and bone scan looks really good.  We will talk about his various treatment options at his follow-up next week.  Hollice Espy, MD

## 2020-11-24 NOTE — Telephone Encounter (Signed)
Returned patient's call, he would like to know results of scans he had completed today. Informed patient as soon as Dr. Erlene Quan reviews scans he would be informed and would go into further detail at next week's appointment and be able to answer questions. Voiced understanding.

## 2020-11-24 NOTE — Telephone Encounter (Signed)
Pt aware of results. Will keep appointment next week.

## 2020-11-26 NOTE — Progress Notes (Signed)
11/27/2020 9:00 AM   Raymond Michael [birth year] [id number]  Referring provider: [facility] [address] [place],  [place] [address]  Chief Complaint  Patient presents with   Prostate Cancer    Biopsy results     HPI: 65 year old male with newly diagnosed prostate cancer presents today to discuss his management options.  Biopsy as below, his multiple cores bilaterally of Gleason 4+3, 3+4 and 3+3.  Large volume up to 86%.  PSA 93.1  In the interim, he is undergone staging CT abdomen pelvis as well as bone scan both of which are negative for any evidence of metastatic disease.  No previous abdominal surgeries.  BMI 38.     IPSS     Row Name 11/27/20 1000         International Prostate Symptom Score   How often have you had the sensation of not emptying your bladder? Not at All     How often have you had to urinate less than every two hours? Less than 1 in 5 times     How often have you found you stopped and started again several times when you urinated? Less than 1 in 5 times     How often have you found it difficult to postpone urination? Less than half the time     How often have you had a weak urinary stream? Less than 1 in 5 times     How often have you had to strain to start urination? Less than 1 in 5 times     How many times did you typically get up at night to urinate? 2 Times     Total IPSS Score 8           Quality of Life due to urinary symptoms   If you were to spend the rest of your life with your urinary condition just the way it is now how would you feel about that? Mostly Satisfied              Score:  1-7 Mild 8-19 Moderate 20-35 Severe   SHIM     Row Name 11/27/20 1018         SHIM: Over the last 6 months:   How do you rate your confidence that you could get and keep an erection? Low     When you had erections with sexual stimulation, how often were your erections hard enough for penetration (entering your partner)?  A Few Times (much less than half the time)     During sexual intercourse, how often were you able to maintain your erection after you had penetrated (entered) your partner? A Few Times (much less than half the time)     During sexual intercourse, how difficult was it to maintain your erection to completion of intercourse? Extremely Difficult     When you attempted sexual intercourse, how often was it satisfactory for you? A Few Times (much less than half the time)           SHIM Total Score   SHIM 9               PMH: Past Medical History:  Diagnosis Date   Diabetes ([place])    Hyperlipidemia     Surgical History: Past Surgical History:  Procedure Laterality Date   NOSE SURGERY     TONSILLECTOMY AND ADENOIDECTOMY      Home Medications:  Allergies as of 11/27/2020   No Known Allergies  Medication List        Accurate as of November 27, 2020  9:00 AM. If you have any questions, ask your nurse or doctor.          Canagliflozin-metFORMIN HCl [phone] MG Tabs Take by mouth.   etanercept 50 MG/ML injection Commonly known as: ENBREL Inject 50 mg into the skin once a week.   gabapentin 100 MG capsule Commonly known as: NEURONTIN Take 1 capsule by mouth twice daily   glipiZIDE 5 MG tablet Commonly known as: GLUCOTROL TAKE 1 TABLET BY MOUTH ONCE DAILY BEFORE BREAKFAST   metFORMIN 1000 MG tablet Commonly known as: GLUCOPHAGE TAKE 1 TABLET BY MOUTH TWICE DAILY WITH MEALS . APPOINTMENT REQUIRED FOR FUTURE REFILLS   naproxen 500 MG tablet Commonly known as: NAPROSYN TAKE 1 TABLET BY MOUTH TWICE A DAY AS NEEDED   pioglitazone 15 MG tablet Commonly known as: ACTOS TAKE 1 TABLET BY MOUTH EVERY DAY   rosuvastatin 10 MG tablet Commonly known as: CRESTOR TAKE 1 TABLET BY MOUTH EVERY DAY   Rybelsus 7 MG Tabs Generic drug: Semaglutide PLEASE SEE ATTACHED FOR DETAILED DIRECTIONS   sildenafil 100 MG tablet Commonly known as: VIAGRA Take 1 tablet (100 mg  total) by mouth daily as needed for erectile dysfunction. Take one tab one hour prior to intercourse   tamsulosin 0.4 MG Caps capsule Commonly known as: FLOMAX Take 1 capsule (0.4 mg total) by mouth daily.   triamcinolone cream 0.1 % Commonly known as: KENALOG Apply 1 application topically 2 (two) times daily.   Xeljanz 5 MG Tabs Generic drug: Tofacitinib Citrate Take 1 tablet by mouth 2 (two) times daily.        Allergies: No Known Allergies  Family History: Family History  Problem Relation Age of Onset   Diabetes Father    Diabetes Brother     Social History:  reports that he has never smoked. He has never used smokeless tobacco. He reports current alcohol use. He reports that he does not use drugs.   Physical Exam: BP 134/77   Pulse 77   Ht 6\' 1"  (1.854 m)   Wt 293 lb (132.9 kg)   BMI 38.66 kg/m   Constitutional:  Alert and oriented, No acute distress. HEENT: [place] AT, moist mucus membranes.  Trachea midline, no masses. Cardiovascular: No clubbing, cyanosis, or edema. Respiratory: Normal respiratory effort, no increased work of breathing. Skin: No rashes, bruises or suspicious lesions. Neurologic: Grossly intact, no focal deficits, moving all 4 extremities. Psychiatric: Normal mood and affect.  Laboratory Data: Lab Results  Component Value Date   WBC 4.8 03/16/2018   HGB 14.4 03/16/2018   HCT 43.7 03/16/2018   MCV 90 03/16/2018   PLT 213 03/16/2018    Lab Results  Component Value Date   CREATININE 0.80 11/24/2020    Lab Results  Component Value Date   TESTOSTERONE 509 11/06/2020    Lab Results  Component Value Date   HGBA1C 10.9 (A) 04/08/2019   Pertinent Imaging:  IMPRESSION: No scintigraphic evidence of osseous metastatic disease.   Focal FDG uptake of the right mandible, likely due to dental disease.   IMPRESSION: Mildly enlarged prostate. No evidence of abdominal or pelvic metastatic disease.   Colonic diverticulosis, without  radiographic evidence of diverticulitis.   Hepatic cirrhosis. No evidence of hepatic neoplasm.   Aortic Atherosclerosis (ICD10-I70.0).     Electronically Signed   By: Marlaine Hind M.D.   On: 11/24/2020 16:18  CT scanning and bone scan  were personally reviewed.  Agree with radiologic interpretation.  Assessment & Plan:    1. Prostate cancer ([place]) Newly diagnosed technically high risk prostate cancer, markedly elevated PSA to 93 along with Gleason 4+3 with tertiary 5 pattern  Staging imaging is negative for any evidence of metastatic disease although based on MSK nomogram, he does have a high risk for micrometastatic disease, extracapsular extension, etc.  The patient was counseled about the natural history of prostate cancer and the standard treatment options that are available for prostate cancer. It was explained to him how his age and life expectancy, clinical stage, Gleason score, and PSA affect his prognosis, the decision to proceed with additional staging studies, as well as how that information influences recommended treatment strategies. We discussed the roles for active surveillance, radiation therapy, surgical therapy, androgen deprivation, as well as ablative therapy options for the treatment of prostate cancer as appropriate to his individual cancer situation. We discussed the risks and benefits of these options with regard to their impact on cancer control and also in terms of potential adverse events, complications, and impact on quality of life particularly related to urinary, bowel, and sexual function. The patient was encouraged to ask questions throughout the discussion today and all questions were answered to his stated satisfaction. In addition, the patient was provided with and/or directed to appropriate resources and literature for further education about prostate cancer treatment options.  We discussed surgical therapy for prostate cancer including the different available  surgical approaches.  Specifically, we discussed robotic prostatectomy with pelvic lymph node dissection (non nerve sparing) based on his restratification.  We discussed, in detail, the risks and expectations of surgery with regard to cancer control, urinary control, and erectile dysfunction as well as expected post operative recovery processed. Additional risks of surgery including but not limited to bleeding, infection, hernia formation, nerve damage, fistula formation, bowel/rectal injury, potentially necessitating colostomy, damage to the urinary tract resulting in urinary leakage, urethral stricture, and cardiopulmonary risk such as myocardial infarction, stroke, death, thromboembolism etc. were explained.   We discussed today that he is slightly less optimal surgical candidate especially given his obesity as well as diabetes.  He is a higher risk for ongoing stress incontinence.  He also has a risk of needing adjuvant therapy which is relatively high.  I do think in helping with decision-making, PMS a PET scan would help more definitive rule out micrometastatic/metastatic disease.  He is open to radiation oncology referral.  If he elects to pursue this, he needed ADT for 2 to 3 years.  We discussed side effects of this as well.  We will have him see Dr. Donella Stade, obtain a PET scan we will follow-up thereafter to help him make his final decision.  - Ambulatory referral to Radiation Oncology - NM PET (PSMA) SKULL TO MID THIGH; Future   Hollice Espy, MD  [facility] [address] [place], [place] [address] [phone]  I spent 40 total minutes on the day of the encounter including pre-visit review of the medical record, face-to-face time with the patient, and post visit ordering of labs/imaging/tests.

## 2020-11-27 ENCOUNTER — Other Ambulatory Visit: Payer: Self-pay

## 2020-11-27 ENCOUNTER — Encounter: Payer: Self-pay | Admitting: Urology

## 2020-11-27 ENCOUNTER — Ambulatory Visit (INDEPENDENT_AMBULATORY_CARE_PROVIDER_SITE_OTHER): Payer: 59 | Admitting: Urology

## 2020-11-27 VITALS — BP 134/77 | HR 77 | Ht 73.0 in | Wt 293.0 lb

## 2020-11-27 DIAGNOSIS — C61 Malignant neoplasm of prostate: Secondary | ICD-10-CM

## 2020-12-02 ENCOUNTER — Ambulatory Visit: Payer: PRIVATE HEALTH INSURANCE | Admitting: Urology

## 2020-12-07 ENCOUNTER — Institutional Professional Consult (permissible substitution): Payer: 59 | Admitting: Radiation Oncology

## 2020-12-09 ENCOUNTER — Other Ambulatory Visit: Payer: Self-pay

## 2020-12-09 ENCOUNTER — Ambulatory Visit
Admission: RE | Admit: 2020-12-09 | Discharge: 2020-12-09 | Disposition: A | Discharge status: Home / Self Care | Payer: 59 | Source: Ambulatory Visit | Attending: Urology | Admitting: Urology

## 2020-12-09 DIAGNOSIS — C61 Malignant neoplasm of prostate: Secondary | ICD-10-CM | POA: Insufficient documentation

## 2020-12-09 DIAGNOSIS — I2510 Atherosclerotic heart disease of native coronary artery without angina pectoris: Secondary | ICD-10-CM | POA: Diagnosis not present

## 2020-12-09 DIAGNOSIS — I700 Atherosclerosis of aorta: Secondary | ICD-10-CM | POA: Diagnosis not present

## 2020-12-09 MED ORDER — PIFLIFOLASTAT F 18 (PYLARIFY) INJECTION
9.0000 | Freq: Once | INTRAVENOUS | Status: AC
  Administered 2020-12-09: 9.95 via INTRAVENOUS

## 2020-12-11 ENCOUNTER — Encounter: Payer: Self-pay | Admitting: Radiation Oncology

## 2020-12-11 ENCOUNTER — Telehealth: Payer: Self-pay | Admitting: Urology

## 2020-12-11 ENCOUNTER — Ambulatory Visit
Admission: RE | Admit: 2020-12-11 | Discharge: 2020-12-11 | Disposition: A | Discharge status: Home / Self Care | Payer: 59 | Source: Ambulatory Visit | Attending: Radiation Oncology | Admitting: Radiation Oncology

## 2020-12-11 ENCOUNTER — Other Ambulatory Visit: Payer: Self-pay

## 2020-12-11 VITALS — BP 151/93 | HR 74 | Temp 96.7°F | Wt 293.3 lb

## 2020-12-11 DIAGNOSIS — E785 Hyperlipidemia, unspecified: Secondary | ICD-10-CM | POA: Insufficient documentation

## 2020-12-11 DIAGNOSIS — E119 Type 2 diabetes mellitus without complications: Secondary | ICD-10-CM | POA: Insufficient documentation

## 2020-12-11 DIAGNOSIS — Z7984 Long term (current) use of oral hypoglycemic drugs: Secondary | ICD-10-CM | POA: Insufficient documentation

## 2020-12-11 DIAGNOSIS — C61 Malignant neoplasm of prostate: Secondary | ICD-10-CM | POA: Diagnosis present

## 2020-12-11 DIAGNOSIS — Z79899 Other long term (current) drug therapy: Secondary | ICD-10-CM | POA: Insufficient documentation

## 2020-12-11 NOTE — Telephone Encounter (Signed)
Called to discuss PET scan findings.  This appears isolated to the prostate.  Briefly discussed his visit with Dr. Baruch Gouty today.  He had a lot of questions and I offered him a virtual visit to continue this discussion.  We will arrange for early next week.

## 2020-12-11 NOTE — Telephone Encounter (Signed)
Scheduled patient on 12/15/20. Sent pt my chart message

## 2020-12-11 NOTE — Consult Note (Signed)
NEW PATIENT EVALUATION  Name: Raymond Michael  MRN: [id number]  Date:   12/11/2020     DOB: [birth year]   This 65 y.o. 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  REFERRING PHYSICIAN: [facility]  CHIEF COMPLAINT:  Chief Complaint  Patient presents with   Prostate Cancer    DIAGNOSIS: The encounter diagnosis was Malignant neoplasm of prostate ([place]).   PREVIOUS INVESTIGATIONS:  Bone scan CT scans and PET CT scans reviewed Pathology report reviewed Clinical notes reviewed  HPI: Patient is a 65 year old male who presented with a PSA at 93.1.  He was evaluated by urology had transrectal ultrasound-guided biopsy showing 11 out of 12 cores positive for adenocarcinoma a mixture of Gleason 7 (4+3) Gleason 7 (3+4) and Gleason 6 (3+3).  He describes very little urinary symptoms specifically denies nocturia frequency and urgency.  He is having no bone pain.  CT scan demonstrated mild enlarged prostate no evidence of abdominal pelvic metastatic disease.  Bone scan was negative for metastatic disease and PET CT scan showed gross disease confined to his prostate.  He is seen today for radiation oncology consultation.  PLANNED TREATMENT REGIMEN: Recommendation for ADT therapy plus external beam radiation therapy to his prostate and pelvic nodes plus I-125 interstitial implant for boost  PAST MEDICAL HISTORY:  has a past medical history of Diabetes ([place]) and Hyperlipidemia.    PAST SURGICAL HISTORY:  Past Surgical History:  Procedure Laterality Date   NOSE SURGERY     TONSILLECTOMY AND ADENOIDECTOMY      FAMILY HISTORY: family history includes Diabetes in his brother and father.  SOCIAL HISTORY:  reports that he has never smoked. He has never used smokeless tobacco. He reports current alcohol use. He reports that he does not use drugs.  ALLERGIES: Patient has  no known allergies.  MEDICATIONS:  Current Outpatient Medications  Medication Sig Dispense Refill   Canagliflozin-metFORMIN HCl [phone] MG TABS Take by mouth.     etanercept (ENBREL) 50 MG/ML injection Inject 50 mg into the skin once a week.     gabapentin (NEURONTIN) 100 MG capsule Take 1 capsule by mouth twice daily 180 capsule 2   glipiZIDE (GLUCOTROL) 5 MG tablet TAKE 1 TABLET BY MOUTH ONCE DAILY BEFORE BREAKFAST 30 tablet 0   metFORMIN (GLUCOPHAGE) 1000 MG tablet TAKE 1 TABLET BY MOUTH TWICE DAILY WITH MEALS . APPOINTMENT REQUIRED FOR FUTURE REFILLS 120 tablet 0   naproxen (NAPROSYN) 500 MG tablet TAKE 1 TABLET BY MOUTH TWICE A DAY AS NEEDED 180 tablet 1   pioglitazone (ACTOS) 15 MG tablet TAKE 1 TABLET BY MOUTH EVERY DAY 90 tablet 0   rosuvastatin (CRESTOR) 10 MG tablet TAKE 1 TABLET BY MOUTH EVERY DAY 90 tablet 3   RYBELSUS 7 MG TABS PLEASE SEE ATTACHED FOR DETAILED DIRECTIONS     sildenafil (VIAGRA) 100 MG tablet Take 1 tablet (100 mg total) by mouth daily as needed for erectile dysfunction. Take one tab one hour prior to intercourse 30 tablet 3   tamsulosin (FLOMAX) 0.4 MG CAPS capsule Take 1 capsule (0.4 mg total) by mouth daily. 90 capsule 3   triamcinolone cream (KENALOG) 0.1 % Apply 1 application topically 2 (two) times daily. 30 g 0   XELJANZ 5 MG TABS Take 1 tablet by mouth 2 (two) times daily.     No current facility-administered medications for this encounter.  ECOG PERFORMANCE STATUS:  0 - Asymptomatic  REVIEW OF SYSTEMS: Patient denies any weight loss, fatigue, weakness, fever, chills or night sweats. Patient denies any loss of vision, blurred vision. Patient denies any ringing  of the ears or hearing loss. No irregular heartbeat. Patient denies heart murmur or history of fainting. Patient denies any chest pain or pain radiating to her upper extremities. Patient denies any shortness of breath, difficulty breathing at night, cough or hemoptysis. Patient denies any  swelling in the lower legs. Patient denies any nausea vomiting, vomiting of blood, or coffee ground material in the vomitus. Patient denies any stomach pain. Patient states has had normal bowel movements no significant constipation or diarrhea. Patient denies any dysuria, hematuria or significant nocturia. Patient denies any problems walking, swelling in the joints or loss of balance. Patient denies any skin changes, loss of hair or loss of weight. Patient denies any excessive worrying or anxiety or significant depression. Patient denies any problems with insomnia. Patient denies excessive thirst, polyuria, polydipsia. Patient denies any swollen glands, patient denies easy bruising or easy bleeding. Patient denies any recent infections, allergies or URI. Patient "s visual fields have not changed significantly in recent time.   PHYSICAL EXAM: BP (!) 151/93   Pulse 74   Temp (!) 96.7 F (35.9 C)   Wt 293 lb 4.8 oz (133 kg)   BMI 38.70 kg/m  Well-developed well-nourished patient in NAD. HEENT reveals PERLA, EOMI, discs not visualized.  Oral cavity is clear. No oral mucosal lesions are identified. Neck is clear without evidence of cervical or supraclavicular adenopathy. Lungs are clear to A&P. Cardiac examination is essentially unremarkable with regular rate and rhythm without murmur rub or thrill. Abdomen is benign with no organomegaly or masses noted. Motor sensory and DTR levels are equal and symmetric in the upper and lower extremities. Cranial nerves II through XII are grossly intact. Proprioception is intact. No peripheral adenopathy or edema is identified. No motor or sensory levels are noted. Crude visual fields are within normal range.  LABORATORY DATA: Pathology report reviewed    RADIOLOGY RESULTS: Bone scan and CT scans and PET CT scans all reviewed compatible with above-stated findings   IMPRESSION: Stage IIIa adenocarcinoma the prostate Gleason 7 (4+3) presenting in a PSA in the 90 range  in 65 year old male PLAN: This time I discussed treatment options with the patient.  My recommendation based on [facility] nomogram showing only a 4% chance of organ confined disease 96% chance of extracapsular extension not 65% lymph node involvement would be for treatment try modality treatment with ADT therapy plus external beam radiation to prostate and pelvic nodes plus I-125 interstitial implant for boost.  Risk and benefits of all treatments including increased lower Neri tract symptoms diarrhea fatigue alteration of blood counts all were discussed in detail with the patient and his daughter.  I have referred him back to Dr. Erlene Quan for Eligard injection.  I have also set up a CT simulation for next week.  Patient also has multiple second opinions over the next week or 2.  Patient knows to call with any concerns concerns.  I would like to take this opportunity to thank you for allowing me to participate in the care of your patient.Noreene Filbert, MD

## 2020-12-14 ENCOUNTER — Telehealth: Payer: Self-pay

## 2020-12-14 NOTE — Progress Notes (Signed)
Virtual Telephone visit   I connected with Raymond Michael on 12/15/2020 at 11:30 AM EDT by a telephone and verified that I am speaking with the correct person using two identifiers.   Location: Patient: Home  Provider: In office   I discussed the limitations of evaluation and management by telemedicine and the availability of in person appointments. The patient expressed understanding and agreed to proceed.  History of Present Illness: Raymond Michael is a 65 y.o. male with a personal history of prostate cancer, who presents today for discussion of treatment options.   His pathology showed multiple cores bilaterally of Gleason 4+3, 3+4 and 3+3.  Large volume up to 86%  In the interim, he is undergone staging CT abdomen pelvis as well as bone scan both of which are negative for any evidence of metastatic disease.  PET scan and PSMA were negative.   He got a second opinion with Dr. Rogers Blocker yesterday.   His most recent PSA on 11/06/2020 was 93.51.     He reports today that he was told his prostate was enlarged by Dr. Rogers Blocker and that was his first time hearing that. He was recommended to undergo gold-seed and radiation for this and external radiation to the lymph nodes.      Assessment and Plan:  Prostate cancer  - Discussed the risk of him undergoing surgery and how his prostate size, comorbidity, and weight are reasoning to why surgery is not his best option.  Additionally, he is a very high risk for needing adjuvant radiation based on [facility] nomogram, extremely high risk for extracapsular extension and lymph node involvement  - Discussed the type of hormones and radiation that would be best for him. We discussed both the hormones and radiation risk and benefits in depth today. Recommend he speak with Dr. Baruch Gouty regarding the length of treatment. He has elected radiation sees with boost.   - We discussed what flare is and the risk for it due to his elevated PSA. This would be associated  with difficulty urinating. Due to this I would like to give him Degarlelix first. We would proceed with Eligard after. He is agreeable with this plan.   Follow Up Instructions:  For Degarelix on 12/22/2020 with Debroah Loop, PA-C.    I discussed the assessment and treatment plan with the patient. The patient was provided an opportunity to ask questions and all were answered. The patient agreed with the plan and demonstrated an understanding of the instructions.   The patient was advised to call back or seek an in-person evaluation if the symptoms worsen or if the condition fails to improve as anticipated.  I provided 30 minutes of non-face-to-face time during this encounter.   I,Kailey Littlejohn,acting as a Education administrator for Hollice Espy, MD.,have documented all relevant documentation on the behalf of Hollice Espy, MD,as directed by  Hollice Espy, MD while in the presence of Hollice Espy, MD.  I have reviewed the above documentation for accuracy and completeness, and I agree with the above.   Hollice Espy, MD

## 2020-12-14 NOTE — Telephone Encounter (Signed)
Benefits investigation forms faxed to [facility] for [place].

## 2020-12-15 ENCOUNTER — Telehealth (INDEPENDENT_AMBULATORY_CARE_PROVIDER_SITE_OTHER): Payer: 59 | Admitting: Urology

## 2020-12-15 ENCOUNTER — Other Ambulatory Visit: Payer: Self-pay

## 2020-12-15 ENCOUNTER — Telehealth: Payer: Self-pay

## 2020-12-15 DIAGNOSIS — C61 Malignant neoplasm of prostate: Secondary | ICD-10-CM | POA: Diagnosis not present

## 2020-12-15 NOTE — Telephone Encounter (Signed)
Attempted PA request for [facility] via phone at [phone] Ref [id number] given CPT code [phone] does not require PA. Transferred to [phone] for J-code G2563, transferred again to [phone]. As of 12/15/20 unable to submit requests via phone, requests can only be done via online portal on [email or web address] PA request started through portal, notes attached to request. Status was sent for review, GOT#[id number]

## 2020-12-15 NOTE — Progress Notes (Signed)
This service is provided via telemedicine   No vital signs collected/recorded due to the encounter was a telemedicine visit.     Patient consents to a telephone visit:  yes    Names of all persons participating in the telemedicine service and their role in the encounter:  Fonnie Jarvis, CMA, Hollice Espy, MD

## 2020-12-16 NOTE — Telephone Encounter (Signed)
Fax reply from [employer] 240mg  dose is approved from 12/16/20- 12/15/21. Case #[id number] Letter is scanned under media

## 2020-12-16 NOTE — Telephone Encounter (Signed)
Incoming approval of prior auth request on Eligard.  Case# [id number].   Approval dates: 12/14/20 - 12/13/21.

## 2020-12-17 ENCOUNTER — Ambulatory Visit
Admission: RE | Admit: 2020-12-17 | Discharge: 2020-12-17 | Disposition: A | Discharge status: Home / Self Care | Payer: 59 | Source: Ambulatory Visit | Attending: Radiation Oncology | Admitting: Radiation Oncology

## 2020-12-17 DIAGNOSIS — Z510 Encounter for antineoplastic radiation therapy: Secondary | ICD-10-CM | POA: Diagnosis present

## 2020-12-17 DIAGNOSIS — C61 Malignant neoplasm of prostate: Secondary | ICD-10-CM | POA: Insufficient documentation

## 2020-12-21 ENCOUNTER — Other Ambulatory Visit: Payer: Self-pay | Admitting: Family Medicine

## 2020-12-21 MED ORDER — SILDENAFIL CITRATE 100 MG PO TABS: 100.0000 mg | ORAL_TABLET | Freq: Every day | ORAL | 3 refills | Status: AC | PRN

## 2020-12-22 ENCOUNTER — Other Ambulatory Visit: Payer: Self-pay

## 2020-12-22 ENCOUNTER — Ambulatory Visit: Payer: 59 | Admitting: Physician Assistant

## 2020-12-22 ENCOUNTER — Encounter: Payer: Self-pay | Admitting: Physician Assistant

## 2020-12-22 VITALS — BP 148/87 | HR 79 | Ht 70.0 in | Wt 293.0 lb

## 2020-12-22 DIAGNOSIS — C61 Malignant neoplasm of prostate: Secondary | ICD-10-CM

## 2020-12-22 MED ORDER — DEGARELIX ACETATE(240 MG DOSE) 120 MG/VIAL ~~LOC~~ SOLR
240.0000 mg | Freq: Once | SUBCUTANEOUS | Status: AC
  Administered 2020-12-22: 240 mg via SUBCUTANEOUS

## 2020-12-22 NOTE — Progress Notes (Signed)
Firmagon Sub Q Injection  Due to Prostate Cancer patient is present today for a Firmagon Injection.   Medication: Mills Koller (Degarelix)  Dose: 240mg  Location: right upper abdomen and left upper abdomen Lot: [id number] Exp: 01/2023  Patient tolerated well, no complications were noted  Performed by: Bradly Bienenstock CMA & Elberta Leatherwood CMA  Follow up: RTC in 1 mo for Eligard

## 2020-12-22 NOTE — Patient Instructions (Signed)
Please take the following dietary supplements for the duration of your hormone suppression therapy to reduce your risk for bone loss: -Calcium 1000-1200mg daily -Vitamin D 800-1000IU daily  

## 2020-12-22 NOTE — Progress Notes (Signed)
Patient presented to clinic today for initiation of 2-3 years of ADT with Mills Koller bridge to Eligard per Dr. Erlene Quan.   Patient reports exercising regularly and maintaining a rather healthy diet.  He has a history of diabetes and ED on metformin, glipizide, and sildenafil. He reports making dietary and exercise modifications once he received his diabetes diagnosis  We discussed the anticipated side effects of ADT today including hot flashes, decreased libido, erectile dysfunction, breast tenderness or size changes, fatigue, weight gain, bone loss, muscle mass loss, brain fog, increased cholesterol, increased blood pressure, increased blood sugar, and increased risk for stroke and heart attack.  I counseled him to maintain a healthy diet and continue regular exercise including weightbearing exercise to mitigate bone and muscle loss for the duration of therapy.  Additionally, I counseled him to start daily calcium 1000-1200mg  and vitamin D 800-1000IU supplements to reduce bone loss.  Lastly, I encouraged him to maintain regular follow-ups with his PCP for monitoring of his diabetes. He expressed understanding, all questions answered. Printed resources on ADT provided today.  Debroah Loop, PA-C 12/22/20 9:13 AM  I spent 18 minutes on the day of the encounter to include pre-visit record review, face-to-face time with the patient, and post-visit ordering of tests.

## 2020-12-24 DIAGNOSIS — Z510 Encounter for antineoplastic radiation therapy: Secondary | ICD-10-CM | POA: Diagnosis not present

## 2020-12-24 NOTE — Telephone Encounter (Signed)
error 

## 2020-12-25 ENCOUNTER — Other Ambulatory Visit: Payer: Self-pay | Admitting: *Deleted

## 2020-12-25 DIAGNOSIS — C61 Malignant neoplasm of prostate: Secondary | ICD-10-CM

## 2020-12-28 ENCOUNTER — Ambulatory Visit: Admission: RE | Admit: 2020-12-28 | Payer: 59 | Source: Ambulatory Visit

## 2020-12-29 ENCOUNTER — Ambulatory Visit
Admission: RE | Admit: 2020-12-29 | Discharge: 2020-12-29 | Disposition: A | Discharge status: Home / Self Care | Payer: 59 | Source: Ambulatory Visit | Attending: Radiation Oncology | Admitting: Radiation Oncology

## 2020-12-29 DIAGNOSIS — Z510 Encounter for antineoplastic radiation therapy: Secondary | ICD-10-CM | POA: Diagnosis not present

## 2020-12-30 ENCOUNTER — Ambulatory Visit
Admission: RE | Admit: 2020-12-30 | Discharge: 2020-12-30 | Disposition: A | Discharge status: Home / Self Care | Payer: 59 | Source: Ambulatory Visit | Attending: Radiation Oncology | Admitting: Radiation Oncology

## 2020-12-30 DIAGNOSIS — Z510 Encounter for antineoplastic radiation therapy: Secondary | ICD-10-CM | POA: Diagnosis not present

## 2020-12-31 ENCOUNTER — Ambulatory Visit
Admission: RE | Admit: 2020-12-31 | Discharge: 2020-12-31 | Disposition: A | Discharge status: Home / Self Care | Payer: 59 | Source: Ambulatory Visit | Attending: Radiation Oncology | Admitting: Radiation Oncology

## 2020-12-31 DIAGNOSIS — Z510 Encounter for antineoplastic radiation therapy: Secondary | ICD-10-CM | POA: Diagnosis not present

## 2021-01-01 ENCOUNTER — Ambulatory Visit
Admission: RE | Admit: 2021-01-01 | Discharge: 2021-01-01 | Disposition: A | Discharge status: Home / Self Care | Payer: 59 | Source: Ambulatory Visit | Attending: Radiation Oncology | Admitting: Radiation Oncology

## 2021-01-01 DIAGNOSIS — Z510 Encounter for antineoplastic radiation therapy: Secondary | ICD-10-CM | POA: Diagnosis not present

## 2021-01-04 ENCOUNTER — Ambulatory Visit
Admission: RE | Admit: 2021-01-04 | Discharge: 2021-01-04 | Disposition: A | Discharge status: Home / Self Care | Payer: 59 | Source: Ambulatory Visit | Attending: Radiation Oncology | Admitting: Radiation Oncology

## 2021-01-04 ENCOUNTER — Other Ambulatory Visit: Payer: Self-pay

## 2021-01-04 ENCOUNTER — Other Ambulatory Visit: Payer: Self-pay | Admitting: Gastroenterology

## 2021-01-04 DIAGNOSIS — Z1211 Encounter for screening for malignant neoplasm of colon: Secondary | ICD-10-CM

## 2021-01-04 DIAGNOSIS — Z510 Encounter for antineoplastic radiation therapy: Secondary | ICD-10-CM | POA: Diagnosis not present

## 2021-01-04 MED ORDER — NA SULFATE-K SULFATE-MG SULF 17.5-3.13-1.6 GM/177ML PO SOLN: 1.0000 | Freq: Once | ORAL | 0 refills | Status: AC

## 2021-01-04 NOTE — Progress Notes (Signed)
Gastroenterology Pre-Procedure Review  Request Date: 02/04/21 Requesting Physician: Dr. Allen Norris  PATIENT REVIEW QUESTIONS: The patient responded to the following health history questions as indicated:    1. Are you having any GI issues? yes (diarrhea due to treatments.) 2. Do you have a personal history of Polyps? no 3. Do you have a family history of Colon Cancer or Polyps? no 4. Diabetes Mellitus? yes (diabetes) 5. Joint replacements in the past 12 months?no 6. Major health problems in the past 3 months?yes (Prostate cancer) 7. Any artificial heart valves, MVP, or defibrillator?no    MEDICATIONS & ALLERGIES:    Patient reports the following regarding taking any anticoagulation/antiplatelet therapy:   Plavix, Coumadin, Eliquis, Xarelto, Lovenox, Pradaxa, Brilinta, or Effient? no Aspirin? no  Patient confirms/reports the following medications:  Current Outpatient Medications  Medication Sig Dispense Refill   etanercept (ENBREL) 50 MG/ML injection Inject 50 mg into the skin once a week.     gabapentin (NEURONTIN) 100 MG capsule Take 1 capsule by mouth twice daily 180 capsule 2   glipiZIDE (GLUCOTROL) 5 MG tablet TAKE 1 TABLET BY MOUTH ONCE DAILY BEFORE BREAKFAST 30 tablet 0   metFORMIN (GLUCOPHAGE) 1000 MG tablet TAKE 1 TABLET BY MOUTH TWICE DAILY WITH MEALS . APPOINTMENT REQUIRED FOR FUTURE REFILLS 120 tablet 0   naproxen (NAPROSYN) 500 MG tablet TAKE 1 TABLET BY MOUTH TWICE A DAY AS NEEDED 180 tablet 1   pioglitazone (ACTOS) 15 MG tablet TAKE 1 TABLET BY MOUTH EVERY DAY 90 tablet 0   rosuvastatin (CRESTOR) 10 MG tablet TAKE 1 TABLET BY MOUTH EVERY DAY 90 tablet 3   RYBELSUS 7 MG TABS PLEASE SEE ATTACHED FOR DETAILED DIRECTIONS     sildenafil (VIAGRA) 100 MG tablet Take 1 tablet (100 mg total) by mouth daily as needed for erectile dysfunction. Take one tab one hour prior to intercourse 30 tablet 3   tamsulosin (FLOMAX) 0.4 MG CAPS capsule Take 1 capsule (0.4 mg total) by mouth daily. 90  capsule 3   triamcinolone cream (KENALOG) 0.1 % Apply 1 application topically 2 (two) times daily. 30 g 0   XELJANZ 5 MG TABS Take 1 tablet by mouth 2 (two) times daily.     No current facility-administered medications for this visit.    Patient confirms/reports the following allergies:  No Known Allergies  No orders of the defined types were placed in this encounter.   AUTHORIZATION INFORMATION Primary Insurance: 1D#: Group #:  Secondary Insurance: 1D#: Group #:  SCHEDULE INFORMATION: Date: 02/04/21 Time: Location: [facility]

## 2021-01-04 NOTE — Addendum Note (Signed)
Addended by: Storm Frisk on: 01/04/2021 05:11 PM   Modules accepted: Orders

## 2021-01-05 ENCOUNTER — Ambulatory Visit
Admission: RE | Admit: 2021-01-05 | Discharge: 2021-01-05 | Disposition: A | Discharge status: Home / Self Care | Payer: 59 | Source: Ambulatory Visit | Attending: Radiation Oncology | Admitting: Radiation Oncology

## 2021-01-05 DIAGNOSIS — Z510 Encounter for antineoplastic radiation therapy: Secondary | ICD-10-CM | POA: Diagnosis not present

## 2021-01-06 ENCOUNTER — Ambulatory Visit
Admission: RE | Admit: 2021-01-06 | Discharge: 2021-01-06 | Disposition: A | Discharge status: Home / Self Care | Payer: 59 | Source: Ambulatory Visit | Attending: Radiation Oncology | Admitting: Radiation Oncology

## 2021-01-06 DIAGNOSIS — Z510 Encounter for antineoplastic radiation therapy: Secondary | ICD-10-CM | POA: Diagnosis not present

## 2021-01-09 ENCOUNTER — Encounter: Payer: Self-pay | Admitting: Family

## 2021-01-11 ENCOUNTER — Ambulatory Visit
Admission: RE | Admit: 2021-01-11 | Discharge: 2021-01-11 | Disposition: A | Discharge status: Home / Self Care | Payer: 59 | Source: Ambulatory Visit | Attending: Radiation Oncology | Admitting: Radiation Oncology

## 2021-01-11 DIAGNOSIS — Z510 Encounter for antineoplastic radiation therapy: Secondary | ICD-10-CM | POA: Diagnosis not present

## 2021-01-12 ENCOUNTER — Telehealth: Payer: Self-pay

## 2021-01-12 ENCOUNTER — Ambulatory Visit: Payer: 59

## 2021-01-12 NOTE — Telephone Encounter (Signed)
Received medical clearance from Dr. Enid Derry office. Patient has been cleared to have procedure. Clearance will be scanned into chart.

## 2021-01-13 ENCOUNTER — Ambulatory Visit
Admission: RE | Admit: 2021-01-13 | Discharge: 2021-01-13 | Disposition: A | Discharge status: Home / Self Care | Payer: 59 | Source: Ambulatory Visit | Attending: Radiation Oncology | Admitting: Radiation Oncology

## 2021-01-13 DIAGNOSIS — Z510 Encounter for antineoplastic radiation therapy: Secondary | ICD-10-CM | POA: Diagnosis not present

## 2021-01-14 ENCOUNTER — Other Ambulatory Visit: Payer: Self-pay

## 2021-01-14 ENCOUNTER — Inpatient Hospital Stay: Payer: 59

## 2021-01-14 ENCOUNTER — Ambulatory Visit
Admission: RE | Admit: 2021-01-14 | Discharge: 2021-01-14 | Disposition: A | Discharge status: Home / Self Care | Payer: 59 | Source: Ambulatory Visit | Attending: Radiation Oncology | Admitting: Radiation Oncology

## 2021-01-14 DIAGNOSIS — C61 Malignant neoplasm of prostate: Secondary | ICD-10-CM | POA: Diagnosis present

## 2021-01-14 DIAGNOSIS — Z510 Encounter for antineoplastic radiation therapy: Secondary | ICD-10-CM | POA: Insufficient documentation

## 2021-01-14 LAB — CBC
HCT: 37.6 % — ABNORMAL LOW (ref 39.0–52.0)
Hemoglobin: 12.7 g/dL — ABNORMAL LOW (ref 13.0–17.0)
MCH: 31.9 pg (ref 26.0–34.0)
MCHC: 33.8 g/dL (ref 30.0–36.0)
MCV: 94.5 fL (ref 80.0–100.0)
Platelets: 145 10*3/uL — ABNORMAL LOW (ref 150–400)
RBC: 3.98 MIL/uL — ABNORMAL LOW (ref 4.22–5.81)
RDW: 15.2 % (ref 11.5–15.5)
WBC: 2.9 10*3/uL — ABNORMAL LOW (ref 4.0–10.5)
nRBC: 0 % (ref 0.0–0.2)

## 2021-01-15 ENCOUNTER — Ambulatory Visit
Admission: RE | Admit: 2021-01-15 | Discharge: 2021-01-15 | Disposition: A | Discharge status: Home / Self Care | Payer: 59 | Source: Ambulatory Visit | Attending: Radiation Oncology | Admitting: Radiation Oncology

## 2021-01-15 DIAGNOSIS — Z510 Encounter for antineoplastic radiation therapy: Secondary | ICD-10-CM | POA: Diagnosis not present

## 2021-01-18 ENCOUNTER — Ambulatory Visit
Admission: RE | Admit: 2021-01-18 | Discharge: 2021-01-18 | Disposition: A | Discharge status: Home / Self Care | Payer: 59 | Source: Ambulatory Visit | Attending: Radiation Oncology | Admitting: Radiation Oncology

## 2021-01-18 DIAGNOSIS — Z510 Encounter for antineoplastic radiation therapy: Secondary | ICD-10-CM | POA: Diagnosis not present

## 2021-01-19 ENCOUNTER — Ambulatory Visit
Admission: RE | Admit: 2021-01-19 | Discharge: 2021-01-19 | Disposition: A | Discharge status: Home / Self Care | Payer: 59 | Source: Ambulatory Visit | Attending: Radiation Oncology | Admitting: Radiation Oncology

## 2021-01-19 ENCOUNTER — Ambulatory Visit: Payer: 59 | Admitting: Physician Assistant

## 2021-01-19 ENCOUNTER — Encounter: Payer: Self-pay | Admitting: Physician Assistant

## 2021-01-19 DIAGNOSIS — Z510 Encounter for antineoplastic radiation therapy: Secondary | ICD-10-CM | POA: Diagnosis not present

## 2021-01-20 ENCOUNTER — Ambulatory Visit
Admission: RE | Admit: 2021-01-20 | Discharge: 2021-01-20 | Disposition: A | Discharge status: Home / Self Care | Payer: 59 | Source: Ambulatory Visit | Attending: Radiation Oncology | Admitting: Radiation Oncology

## 2021-01-20 DIAGNOSIS — Z510 Encounter for antineoplastic radiation therapy: Secondary | ICD-10-CM | POA: Diagnosis not present

## 2021-01-21 ENCOUNTER — Ambulatory Visit
Admission: RE | Admit: 2021-01-21 | Discharge: 2021-01-21 | Disposition: A | Discharge status: Home / Self Care | Payer: 59 | Source: Ambulatory Visit | Attending: Radiation Oncology | Admitting: Radiation Oncology

## 2021-01-21 DIAGNOSIS — Z510 Encounter for antineoplastic radiation therapy: Secondary | ICD-10-CM | POA: Diagnosis not present

## 2021-01-22 ENCOUNTER — Ambulatory Visit: Payer: 59

## 2021-01-25 ENCOUNTER — Ambulatory Visit
Admission: RE | Admit: 2021-01-25 | Discharge: 2021-01-25 | Disposition: A | Discharge status: Home / Self Care | Payer: 59 | Source: Ambulatory Visit | Attending: Radiation Oncology | Admitting: Radiation Oncology

## 2021-01-25 ENCOUNTER — Other Ambulatory Visit: Payer: Self-pay

## 2021-01-25 ENCOUNTER — Ambulatory Visit (INDEPENDENT_AMBULATORY_CARE_PROVIDER_SITE_OTHER): Payer: 59

## 2021-01-25 DIAGNOSIS — C61 Malignant neoplasm of prostate: Secondary | ICD-10-CM | POA: Diagnosis not present

## 2021-01-25 DIAGNOSIS — Z510 Encounter for antineoplastic radiation therapy: Secondary | ICD-10-CM | POA: Diagnosis not present

## 2021-01-25 MED ORDER — LEUPROLIDE ACETATE (6 MONTH) 45 MG ~~LOC~~ KIT
45.0000 mg | PACK | Freq: Once | SUBCUTANEOUS | Status: AC
Start: 2021-01-25 — End: 2021-01-25
  Administered 2021-01-25: 45 mg via SUBCUTANEOUS

## 2021-01-25 NOTE — Patient Instructions (Signed)
Leuprolide depot injection What is this medication? LEUPROLIDE (loo PROE lide) is a man-made protein that acts like a natural hormone in the body. It decreases testosterone in men and decreases estrogen in women. In men, this medicine is used to treat advanced prostate cancer. In women, some forms of this medicine may be used to treat endometriosis, uterine fibroids, or other male hormone-related problems. This medicine may be used for other purposes; ask your health care provider or pharmacist if you have questions. COMMON BRAND NAME(S): Eligard, Fensolv, Lupron Depot, Lupron Depot-Ped, Viadur What should I tell my care team before I take this medication? They need to know if you have any of these conditions: diabetes heart disease or previous heart attack high blood pressure high cholesterol mental illness osteoporosis pain or difficulty passing urine seizures spinal cord metastasis stroke suicidal thoughts, plans, or attempt; a previous suicide attempt by you or a family member tobacco smoker unusual vaginal bleeding (women) an unusual or allergic reaction to leuprolide, benzyl alcohol, other medicines, foods, dyes, or preservatives pregnant or trying to get pregnant breast-feeding How should I use this medication? This medicine is for injection into a muscle or for injection under the skin. It is given by a health care professional in a hospital or clinic setting. The specific product will determine how it will be given to you. Make sure you understand which product you receive and how often you will receive it. Talk to your pediatrician regarding the use of this medicine in children. Special care may be needed. Overdosage: If you think you have taken too much of this medicine contact a poison control center or emergency room at once. NOTE: This medicine is only for you. Do not share this medicine with others. What if I miss a dose? It is important not to miss a dose. Call your  doctor or health care professional if you are unable to keep an appointment. Depot injections: Depot injections are given either once-monthly, every 12 weeks, every 16 weeks, or every 24 weeks depending on the product you are prescribed. The product you are prescribed will be based on if you are male or male, and your condition. Make sure you understand your product and dosing. What may interact with this medication? Do not take this medicine with any of the following medications: chasteberry cisapride dronedarone pimozide thioridazine This medicine may also interact with the following medications: herbal or dietary supplements, like black cohosh or DHEA male hormones, like estrogens or progestins and birth control pills, patches, rings, or injections male hormones, like testosterone other medicines that prolong the QT interval (abnormal heart rhythm) This list may not describe all possible interactions. Give your health care provider a list of all the medicines, herbs, non-prescription drugs, or dietary supplements you use. Also tell them if you smoke, drink alcohol, or use illegal drugs. Some items may interact with your medicine. What should I watch for while using this medication? Visit your doctor or health care professional for regular checks on your progress. During the first weeks of treatment, your symptoms may get worse, but then will improve as you continue your treatment. You may get hot flashes, increased bone pain, increased difficulty passing urine, or an aggravation of nerve symptoms. Discuss these effects with your doctor or health care professional, some of them may improve with continued use of this medicine. Male patients may experience a menstrual cycle or spotting during the first months of therapy with this medicine. If this continues, contact your doctor or  health care professional. This medicine may increase blood sugar. Ask your healthcare provider if changes in diet  or medicines are needed if you have diabetes. What side effects may I notice from receiving this medication? Side effects that you should report to your doctor or health care professional as soon as possible: allergic reactions like skin rash, itching or hives, swelling of the face, lips, or tongue breathing problems chest pain depression or memory disorders pain in your legs or groin pain at site where injected or implanted seizures severe headache signs and symptoms of high blood sugar such as being more thirsty or hungry or having to urinate more than normal. You may also feel very tired or have blurry vision swelling of the feet and legs suicidal thoughts or other mood changes visual changes vomiting Side effects that usually do not require medical attention (report to your doctor or health care professional if they continue or are bothersome): breast swelling or tenderness decrease in sex drive or performance diarrhea hot flashes loss of appetite muscle, joint, or bone pains nausea redness or irritation at site where injected or implanted skin problems or acne This list may not describe all possible side effects. Call your doctor for medical advice about side effects. You may report side effects to FDA at 1-800-FDA-1088. Where should I keep my medication? This drug is given in a hospital or clinic and will not be stored at home. NOTE: This sheet is a summary. It may not cover all possible information. If you have questions about this medicine, talk to your doctor, pharmacist, or health care provider.  2022 Elsevier/Gold Standard (2020-10-20 00:00:00)

## 2021-01-25 NOTE — Progress Notes (Signed)
Eligard SubQ Injection   Due to Prostate Cancer patient is present today for a Eligard Injection.  Medication: Eligard 6 month Dose: 45 mg  Location: right  Lot: [id number] Exp: 04/2022  Patient tolerated well, no complications were noted.  Performed by: Gordy Clement, [place]   Per Dr. Erlene Quan  patient is to continue therapy for 2-3 years . Patient's next follow up was scheduled for May 2023. This appointment was scheduled using wheel and given to patient today along with reminder continue on Vitamin D 800-1000iu and Calium 1000-1200mg  daily while on Androgen Deprivation Therapy.  PA approval dates: 12/14/20 - 12/13/21.

## 2021-01-26 ENCOUNTER — Ambulatory Visit
Admission: RE | Admit: 2021-01-26 | Discharge: 2021-01-26 | Disposition: A | Discharge status: Home / Self Care | Payer: 59 | Source: Ambulatory Visit | Attending: Radiation Oncology | Admitting: Radiation Oncology

## 2021-01-26 DIAGNOSIS — Z510 Encounter for antineoplastic radiation therapy: Secondary | ICD-10-CM | POA: Diagnosis not present

## 2021-01-27 ENCOUNTER — Ambulatory Visit
Admission: RE | Admit: 2021-01-27 | Discharge: 2021-01-27 | Disposition: A | Discharge status: Home / Self Care | Payer: 59 | Source: Ambulatory Visit | Attending: Radiation Oncology | Admitting: Radiation Oncology

## 2021-01-27 DIAGNOSIS — Z510 Encounter for antineoplastic radiation therapy: Secondary | ICD-10-CM | POA: Diagnosis not present

## 2021-01-28 ENCOUNTER — Ambulatory Visit: Payer: 59

## 2021-01-28 ENCOUNTER — Other Ambulatory Visit: Payer: Self-pay

## 2021-01-28 DIAGNOSIS — C61 Malignant neoplasm of prostate: Secondary | ICD-10-CM

## 2021-01-28 NOTE — Progress Notes (Signed)
[facility] Posting Form   Surgery Date/Time: Date: 02/22/2021  Surgeon: Dr. Hollice Espy, MD   Surgery Location: Day Surgery  Inpt ( No  )   Outpt (Yes)   Obs ( No  )   Diagnosis: C61 Prostate Cancer   -CPT: 11021, [phone]  Surgery: RadioActive Seed Implant, Brachytherapy  *Orders entered into EPIC  Date: 01/28/21   *Case booked in EPIC  Date: 01/28/21  *Notified pt of Surgery: Date: 01/28/21  PRE-OP UA & CX: no   *Placed into Prior Authorization Work [place] Date: 01/28/21   Assistant/laser/rep:No

## 2021-01-29 ENCOUNTER — Ambulatory Visit
Admission: RE | Admit: 2021-01-29 | Discharge: 2021-01-29 | Disposition: A | Discharge status: Home / Self Care | Payer: 59 | Source: Ambulatory Visit | Attending: Radiation Oncology | Admitting: Radiation Oncology

## 2021-01-29 DIAGNOSIS — Z510 Encounter for antineoplastic radiation therapy: Secondary | ICD-10-CM | POA: Diagnosis not present

## 2021-02-01 ENCOUNTER — Ambulatory Visit
Admission: RE | Admit: 2021-02-01 | Discharge: 2021-02-01 | Disposition: A | Discharge status: Home / Self Care | Payer: 59 | Source: Ambulatory Visit | Attending: Radiation Oncology | Admitting: Radiation Oncology

## 2021-02-01 DIAGNOSIS — Z510 Encounter for antineoplastic radiation therapy: Secondary | ICD-10-CM | POA: Diagnosis not present

## 2021-02-02 ENCOUNTER — Ambulatory Visit
Admission: RE | Admit: 2021-02-02 | Discharge: 2021-02-02 | Disposition: A | Discharge status: Home / Self Care | Payer: 59 | Source: Ambulatory Visit | Attending: Radiation Oncology | Admitting: Radiation Oncology

## 2021-02-02 DIAGNOSIS — Z510 Encounter for antineoplastic radiation therapy: Secondary | ICD-10-CM | POA: Diagnosis not present

## 2021-02-03 ENCOUNTER — Ambulatory Visit
Admission: RE | Admit: 2021-02-03 | Discharge: 2021-02-03 | Disposition: A | Discharge status: Home / Self Care | Payer: 59 | Source: Ambulatory Visit | Attending: Radiation Oncology | Admitting: Radiation Oncology

## 2021-02-03 ENCOUNTER — Encounter: Payer: Self-pay | Admitting: Radiation Oncology

## 2021-02-03 ENCOUNTER — Ambulatory Visit: Payer: 59

## 2021-02-03 ENCOUNTER — Ambulatory Visit: Admission: RE | Admit: 2021-02-03 | Payer: 59 | Source: Home / Self Care

## 2021-02-03 ENCOUNTER — Other Ambulatory Visit: Payer: Self-pay

## 2021-02-03 VITALS — BP 148/87 | HR 74 | Temp 96.5°F | Wt 295.7 lb

## 2021-02-03 DIAGNOSIS — C61 Malignant neoplasm of prostate: Secondary | ICD-10-CM | POA: Diagnosis not present

## 2021-02-03 DIAGNOSIS — Z923 Personal history of irradiation: Secondary | ICD-10-CM | POA: Insufficient documentation

## 2021-02-03 DIAGNOSIS — Z79899 Other long term (current) drug therapy: Secondary | ICD-10-CM | POA: Insufficient documentation

## 2021-02-03 DIAGNOSIS — Z510 Encounter for antineoplastic radiation therapy: Secondary | ICD-10-CM | POA: Diagnosis not present

## 2021-02-03 DIAGNOSIS — E785 Hyperlipidemia, unspecified: Secondary | ICD-10-CM | POA: Diagnosis not present

## 2021-02-03 DIAGNOSIS — E119 Type 2 diabetes mellitus without complications: Secondary | ICD-10-CM | POA: Insufficient documentation

## 2021-02-03 DIAGNOSIS — Z7984 Long term (current) use of oral hypoglycemic drugs: Secondary | ICD-10-CM | POA: Diagnosis not present

## 2021-02-03 SURGERY — ULTRASOUND, PROSTATE, FOR VOLUME DETERMINATION
Anesthesia: Choice

## 2021-02-03 NOTE — H&P (Signed)
NEW PATIENT EVALUATION  Name: Raymond Michael  MRN: [id number]  Date:   02/03/2021     DOB: [birth year]   This 65 y.o. male patient presents to the clinic for history and physical anticipation of I-125 interstitial implant  REFERRING PHYSICIAN: [facility]  CHIEF COMPLAINT:  Chief Complaint  Patient presents with   Prostate Cancer    DIAGNOSIS: The encounter diagnosis was Malignant neoplasm of prostate ([place]).   PREVIOUS INVESTIGATIONS:  Clinical notes reviewed  HPI: Patient is a 65 year old male who presented with a PSA of 93.1.  Status post transrectal ultrasound-guided biopsy showing 11 of 12 cores positive for adenocarcinoma mostly Gleason 7 (4+3).  He has been undergoing external beam radiation therapy to his prostate and pelvic nodes which he is tolerating well.  He is seen today for volume study in anticipation of an I-125 interstitial implant for boost.  Patient is also been on ADT therapy.  PLANNED TREATMENT REGIMEN: I-125 interstitial implant  PAST MEDICAL HISTORY:  has a past medical history of Diabetes ([place]) and Hyperlipidemia.    PAST SURGICAL HISTORY:  Past Surgical History:  Procedure Laterality Date   NOSE SURGERY     TONSILLECTOMY AND ADENOIDECTOMY      FAMILY HISTORY: family history includes Diabetes in his brother and father.  SOCIAL HISTORY:  reports that he has never smoked. He has never used smokeless tobacco. He reports current alcohol use. He reports that he does not use drugs.  ALLERGIES: Patient has no known allergies.  MEDICATIONS:  Current Outpatient Medications  Medication Sig Dispense Refill   Calcium Carb-Cholecalciferol (CALCIUM + D3 PO) Take 2 tablets by mouth in the morning.     Cholecalciferol (VITAMIN D3 PO) Take 1 tablet by mouth in the morning.     clobetasol cream (TEMOVATE) 4.01 % Apply 1 application topically 2 (two) times daily as needed.     diclofenac (CATAFLAM) 50 MG tablet Take 50-100 mg by mouth See admin  instructions. Take 2 tablets (100 mg) by mouth in the morning & take 1 tablet (50 mg) by mouth in the evening.     diclofenac (VOLTAREN) 50 MG EC tablet Take 50 mg by mouth See admin instructions. Take 2 tablets (100 mg) by mouth in the morning & take 1 tablet (50 mg) by mouth at night.     glipiZIDE (GLUCOTROL) 5 MG tablet TAKE 1 TABLET BY MOUTH ONCE DAILY BEFORE BREAKFAST 30 tablet 0   ibuprofen (ADVIL) 200 MG tablet Take 400 mg by mouth every 8 (eight) hours as needed (for pain.).     metFORMIN (GLUCOPHAGE) 1000 MG tablet TAKE 1 TABLET BY MOUTH TWICE DAILY WITH MEALS . APPOINTMENT REQUIRED FOR FUTURE REFILLS 120 tablet 0   Omega-3 Fatty Acids (FISH OIL PO) Take 2 capsules by mouth in the morning and at bedtime.     pioglitazone (ACTOS) 15 MG tablet TAKE 1 TABLET BY MOUTH EVERY DAY 90 tablet 0   rosuvastatin (CRESTOR) 10 MG tablet TAKE 1 TABLET BY MOUTH EVERY DAY 90 tablet 3   RYBELSUS 7 MG TABS PLEASE SEE ATTACHED FOR DETAILED DIRECTIONS     sildenafil (VIAGRA) 100 MG tablet Take 1 tablet (100 mg total) by mouth daily as needed for erectile dysfunction. Take one tab one hour prior to intercourse 30 tablet 3   tamsulosin (FLOMAX) 0.4 MG CAPS capsule Take 1 capsule (0.4 mg total) by mouth daily. 90 capsule 3   XELJANZ 5 MG TABS Take 5 mg by mouth 2 (two)  times daily.     gabapentin (NEURONTIN) 100 MG capsule Take 1 capsule by mouth twice daily (Patient not taking: Reported on 01/29/2021) 180 capsule 2   naproxen (NAPROSYN) 500 MG tablet TAKE 1 TABLET BY MOUTH TWICE A DAY AS NEEDED (Patient not taking: Reported on 01/29/2021) 180 tablet 1   No current facility-administered medications for this encounter.    ECOG PERFORMANCE STATUS:  0 - Asymptomatic  REVIEW OF SYSTEMS: Patient denies any weight loss, fatigue, weakness, fever, chills or night sweats. Patient denies any loss of vision, blurred vision. Patient denies any ringing  of the ears or hearing loss. No irregular heartbeat. Patient denies  heart murmur or history of fainting. Patient denies any chest pain or pain radiating to her upper extremities. Patient denies any shortness of breath, difficulty breathing at night, cough or hemoptysis. Patient denies any swelling in the lower legs. Patient denies any nausea vomiting, vomiting of blood, or coffee ground material in the vomitus. Patient denies any stomach pain. Patient states has had normal bowel movements no significant constipation or diarrhea. Patient denies any dysuria, hematuria or significant nocturia. Patient denies any problems walking, swelling in the joints or loss of balance. Patient denies any skin changes, loss of hair or loss of weight. Patient denies any excessive worrying or anxiety or significant depression. Patient denies any problems with insomnia. Patient denies excessive thirst, polyuria, polydipsia. Patient denies any swollen glands, patient denies easy bruising or easy bleeding. Patient denies any recent infections, allergies or URI. Patient "s visual fields have not changed significantly in recent time.   PHYSICAL EXAM: BP (!) 148/87    Pulse 74    Temp (!) 96.5 F (35.8 C) (Tympanic)    Wt 295 lb 11.2 oz (134.1 kg)    BMI 42.43 kg/m  Well-developed well-nourished patient in NAD. HEENT reveals PERLA, EOMI, discs not visualized.  Oral cavity is clear. No oral mucosal lesions are identified. Neck is clear without evidence of cervical or supraclavicular adenopathy. Lungs are clear to A&P. Cardiac examination is essentially unremarkable with regular rate and rhythm without murmur rub or thrill. Abdomen is benign with no organomegaly or masses noted. Motor sensory and DTR levels are equal and symmetric in the upper and lower extremities. Cranial nerves II through XII are grossly intact. Proprioception is intact. No peripheral adenopathy or edema is identified. No motor or sensory levels are noted. Crude visual fields are within normal range.  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  Risks and benefits of treatment including radiation safety precautions all were reviewed with the patient.  His volume study was successfully performed.  Patient comprehends her recommendations and our treatment plan well.  Macro thanks  Noreene Filbert, MD

## 2021-02-03 NOTE — Progress Notes (Signed)
Radiation Oncology Volume study note  Name: Raymond Michael   Date:   02/03/2021 MRN:  [id number] DOB: 1955-08-13    This 65 y.o. male presents to the OR today for a volume study in anticipation of an I-125 interstitial implant for adenocarcinoma prostate boost. REFERRING PROVIDER: [facility]  HPI:  Patient is a 65 year old male who presented with a PSA at 93.1.  He was evaluated by urology had transrectal ultrasound-guided biopsy showing 11 out of 12 cores positive for adenocarcinoma a mixture of Gleason 7 (4+3) Gleason 7 (3+4) and Gleason 6 (3+3).  He describes very little urinary symptoms specifically denies nocturia frequency and urgency.  He is having no bone pain.  CT scan demonstrated mild enlarged prostate no evidence of abdominal pelvic metastatic disease.  Bone scan was negative for metastatic disease and PET CT scan showed gross disease confined to his prostate.  Patient has been undergoing external beam radiation therapy to his prostate and pelvic nodes which he is tolerated well.  Seen today for volume study in anticipation of I-125 interstitial implant for boost.  COMPLICATIONS OF TREATMENT: none  FOLLOW UP COMPLIANCE: keeps appointments   PHYSICAL EXAM:  There were no vitals taken for this visit. Well-developed well-nourished patient in NAD. HEENT reveals PERLA, EOMI, discs not visualized.  Oral cavity is clear. No oral mucosal lesions are identified. Neck is clear without evidence of cervical or supraclavicular adenopathy. Lungs are clear to A&P. Cardiac examination is essentially unremarkable with regular rate and rhythm without murmur rub or thrill. Abdomen is benign with no organomegaly or masses noted. Motor sensory and DTR levels are equal and symmetric in the upper and lower extremities. Cranial nerves II through XII are grossly intact. Proprioception is intact. No peripheral adenopathy or edema is identified. No motor or sensory levels are noted. Crude visual  fields are within normal range.  RADIOLOGY RESULTS: Ultrasound used for volume study  PLAN: Patient was taken to the cystoscopy suite in the OR. Patient was placed in the low lithotomy position. Foley catheter was placed. Trans-rectal ultrasound probe was inserted into the rectum and prostate seminal vesicles were visualized as well as bladder base. stepping images were performed on a 5 mm increments. Images will be placed in [facility] treatment planning system to determine seed placement coordinates for eventual I-125 interstitial implant. Images will be reviewed with the physics and dosimetry staff for final quality approval. I personally was present for the volume study and assisted in delineation of contour volumes.  At the end of the procedure Foley catheter was removed, rectal ultrasound probe was removed. Patient tolerated his procedures extremely well with no side effects or complaints. Patient has given appointment for interstitial implant date. Consent was signed today as well as history and physical performed in preparation for his outpatient surgical implant.     Noreene Filbert, MD

## 2021-02-03 NOTE — H&P (View-Only) (Signed)
NEW PATIENT EVALUATION  Name: Raymond Michael  MRN: [id number]  Date:   02/03/2021     DOB: [birth year]   This 65 y.o. male patient presents to the clinic for history and physical anticipation of I-125 interstitial implant  REFERRING PHYSICIAN: [facility]  CHIEF COMPLAINT:  Chief Complaint  Patient presents with   Prostate Cancer    DIAGNOSIS: The encounter diagnosis was Malignant neoplasm of prostate ([place]).   PREVIOUS INVESTIGATIONS:  Clinical notes reviewed  HPI: Patient is a 65 year old male who presented with a PSA of 93.1.  Status post transrectal ultrasound-guided biopsy showing 11 of 12 cores positive for adenocarcinoma mostly Gleason 7 (4+3).  He has been undergoing external beam radiation therapy to his prostate and pelvic nodes which he is tolerating well.  He is seen today for volume study in anticipation of an I-125 interstitial implant for boost.  Patient is also been on ADT therapy.  PLANNED TREATMENT REGIMEN: I-125 interstitial implant  PAST MEDICAL HISTORY:  has a past medical history of Diabetes ([place]) and Hyperlipidemia.    PAST SURGICAL HISTORY:  Past Surgical History:  Procedure Laterality Date   NOSE SURGERY     TONSILLECTOMY AND ADENOIDECTOMY      FAMILY HISTORY: family history includes Diabetes in his brother and father.  SOCIAL HISTORY:  reports that he has never smoked. He has never used smokeless tobacco. He reports current alcohol use. He reports that he does not use drugs.  ALLERGIES: Patient has no known allergies.  MEDICATIONS:  Current Outpatient Medications  Medication Sig Dispense Refill   Calcium Carb-Cholecalciferol (CALCIUM + D3 PO) Take 2 tablets by mouth in the morning.     Cholecalciferol (VITAMIN D3 PO) Take 1 tablet by mouth in the morning.     clobetasol cream (TEMOVATE) 9.62 % Apply 1 application topically 2 (two) times daily as needed.     diclofenac (CATAFLAM) 50 MG tablet Take 50-100 mg by mouth See admin  instructions. Take 2 tablets (100 mg) by mouth in the morning & take 1 tablet (50 mg) by mouth in the evening.     diclofenac (VOLTAREN) 50 MG EC tablet Take 50 mg by mouth See admin instructions. Take 2 tablets (100 mg) by mouth in the morning & take 1 tablet (50 mg) by mouth at night.     glipiZIDE (GLUCOTROL) 5 MG tablet TAKE 1 TABLET BY MOUTH ONCE DAILY BEFORE BREAKFAST 30 tablet 0   ibuprofen (ADVIL) 200 MG tablet Take 400 mg by mouth every 8 (eight) hours as needed (for pain.).     metFORMIN (GLUCOPHAGE) 1000 MG tablet TAKE 1 TABLET BY MOUTH TWICE DAILY WITH MEALS . APPOINTMENT REQUIRED FOR FUTURE REFILLS 120 tablet 0   Omega-3 Fatty Acids (FISH OIL PO) Take 2 capsules by mouth in the morning and at bedtime.     pioglitazone (ACTOS) 15 MG tablet TAKE 1 TABLET BY MOUTH EVERY DAY 90 tablet 0   rosuvastatin (CRESTOR) 10 MG tablet TAKE 1 TABLET BY MOUTH EVERY DAY 90 tablet 3   RYBELSUS 7 MG TABS PLEASE SEE ATTACHED FOR DETAILED DIRECTIONS     sildenafil (VIAGRA) 100 MG tablet Take 1 tablet (100 mg total) by mouth daily as needed for erectile dysfunction. Take one tab one hour prior to intercourse 30 tablet 3   tamsulosin (FLOMAX) 0.4 MG CAPS capsule Take 1 capsule (0.4 mg total) by mouth daily. 90 capsule 3   XELJANZ 5 MG TABS Take 5 mg by mouth 2 (two)  times daily.     gabapentin (NEURONTIN) 100 MG capsule Take 1 capsule by mouth twice daily (Patient not taking: Reported on 01/29/2021) 180 capsule 2   naproxen (NAPROSYN) 500 MG tablet TAKE 1 TABLET BY MOUTH TWICE A DAY AS NEEDED (Patient not taking: Reported on 01/29/2021) 180 tablet 1   No current facility-administered medications for this encounter.    ECOG PERFORMANCE STATUS:  0 - Asymptomatic  REVIEW OF SYSTEMS: Patient denies any weight loss, fatigue, weakness, fever, chills or night sweats. Patient denies any loss of vision, blurred vision. Patient denies any ringing  of the ears or hearing loss. No irregular heartbeat. Patient denies  heart murmur or history of fainting. Patient denies any chest pain or pain radiating to her upper extremities. Patient denies any shortness of breath, difficulty breathing at night, cough or hemoptysis. Patient denies any swelling in the lower legs. Patient denies any nausea vomiting, vomiting of blood, or coffee ground material in the vomitus. Patient denies any stomach pain. Patient states has had normal bowel movements no significant constipation or diarrhea. Patient denies any dysuria, hematuria or significant nocturia. Patient denies any problems walking, swelling in the joints or loss of balance. Patient denies any skin changes, loss of hair or loss of weight. Patient denies any excessive worrying or anxiety or significant depression. Patient denies any problems with insomnia. Patient denies excessive thirst, polyuria, polydipsia. Patient denies any swollen glands, patient denies easy bruising or easy bleeding. Patient denies any recent infections, allergies or URI. Patient "s visual fields have not changed significantly in recent time.   PHYSICAL EXAM: BP (!) 148/87    Pulse 74    Temp (!) 96.5 F (35.8 C) (Tympanic)    Wt 295 lb 11.2 oz (134.1 kg)    BMI 42.43 kg/m  Well-developed well-nourished patient in NAD. HEENT reveals PERLA, EOMI, discs not visualized.  Oral cavity is clear. No oral mucosal lesions are identified. Neck is clear without evidence of cervical or supraclavicular adenopathy. Lungs are clear to A&P. Cardiac examination is essentially unremarkable with regular rate and rhythm without murmur rub or thrill. Abdomen is benign with no organomegaly or masses noted. Motor sensory and DTR levels are equal and symmetric in the upper and lower extremities. Cranial nerves II through XII are grossly intact. Proprioception is intact. No peripheral adenopathy or edema is identified. No motor or sensory levels are noted. Crude visual fields are within normal range.  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  Risks and benefits of treatment including radiation safety precautions all were reviewed with the patient.  His volume study was successfully performed.  Patient comprehends her recommendations and our treatment plan well.  Macro thanks  Noreene Filbert, MD

## 2021-02-04 ENCOUNTER — Ambulatory Visit
Admission: RE | Admit: 2021-02-04 | Discharge: 2021-02-04 | Disposition: A | Discharge status: Home / Self Care | Payer: 59 | Source: Ambulatory Visit | Attending: Radiation Oncology | Admitting: Radiation Oncology

## 2021-02-04 ENCOUNTER — Ambulatory Visit: Payer: 59

## 2021-02-04 ENCOUNTER — Ambulatory Visit: Admit: 2021-02-04 | Payer: 59 | Admitting: Gastroenterology

## 2021-02-04 DIAGNOSIS — Z510 Encounter for antineoplastic radiation therapy: Secondary | ICD-10-CM | POA: Diagnosis not present

## 2021-02-04 SURGERY — COLONOSCOPY WITH PROPOFOL
Anesthesia: General

## 2021-02-05 ENCOUNTER — Ambulatory Visit
Admission: RE | Admit: 2021-02-05 | Discharge: 2021-02-05 | Disposition: A | Discharge status: Home / Self Care | Payer: 59 | Source: Ambulatory Visit | Attending: Radiation Oncology | Admitting: Radiation Oncology

## 2021-02-05 ENCOUNTER — Ambulatory Visit: Payer: 59

## 2021-02-05 DIAGNOSIS — Z510 Encounter for antineoplastic radiation therapy: Secondary | ICD-10-CM | POA: Diagnosis not present

## 2021-02-09 ENCOUNTER — Ambulatory Visit
Admission: RE | Admit: 2021-02-09 | Discharge: 2021-02-09 | Disposition: A | Discharge status: Home / Self Care | Payer: 59 | Source: Ambulatory Visit | Attending: Radiation Oncology | Admitting: Radiation Oncology

## 2021-02-09 ENCOUNTER — Ambulatory Visit: Payer: 59

## 2021-02-09 DIAGNOSIS — Z510 Encounter for antineoplastic radiation therapy: Secondary | ICD-10-CM | POA: Diagnosis not present

## 2021-02-10 ENCOUNTER — Ambulatory Visit
Admission: RE | Admit: 2021-02-10 | Discharge: 2021-02-10 | Disposition: A | Discharge status: Home / Self Care | Payer: 59 | Source: Ambulatory Visit | Attending: Radiation Oncology | Admitting: Radiation Oncology

## 2021-02-10 DIAGNOSIS — Z510 Encounter for antineoplastic radiation therapy: Secondary | ICD-10-CM | POA: Diagnosis not present

## 2021-02-12 ENCOUNTER — Other Ambulatory Visit: Payer: Self-pay

## 2021-02-12 ENCOUNTER — Other Ambulatory Visit
Admission: RE | Admit: 2021-02-12 | Discharge: 2021-02-12 | Disposition: A | Discharge status: Home / Self Care | Payer: 59 | Source: Ambulatory Visit | Attending: Urology | Admitting: Urology

## 2021-02-12 DIAGNOSIS — I10 Essential (primary) hypertension: Secondary | ICD-10-CM

## 2021-02-12 HISTORY — DX: Malignant (primary) neoplasm, unspecified: C80.1

## 2021-02-12 HISTORY — DX: Gastro-esophageal reflux disease without esophagitis: K21.9

## 2021-02-12 HISTORY — DX: Unspecified osteoarthritis, unspecified site: M19.90

## 2021-02-12 NOTE — Patient Instructions (Addendum)
Your procedure is scheduled on: 02/22/21 - Monday Report to the Registration Desk on the 1st floor of the [facility]. To find out your arrival time, please call [phone] between 1PM - 3PM on: 02/19/21 - Friday Report to [facility] for Labs/EKG on 02/19/21 at 10:30 am   REMEMBER: Instructions that are not followed completely may result in serious medical risk, up to and including death; or upon the discretion of your surgeon and anesthesiologist your surgery may need to be rescheduled.  Do not eat food or drink any fluids after midnight the night before surgery.  No gum chewing, lozengers or hard candies.  TAKE only THESE MEDICATIONS THE MORNING OF SURGERY WITH A SIP OF WATER:  - tamsulosin (FLOMAX) 0.4 MG CAPS capsule - rosuvastatin (CRESTOR) 10 MG tablet  Do Not Take MetFORMIN (GLUCOPHAGE) 1000 MG tablet on 01/07, 01/08 and do not take the day of surgery.  One week prior to surgery: diclofenac  Stop Anti-inflammatories (NSAIDS) such as Advil, Aleve, Ibuprofen, Motrin, Naproxen, Naprosyn and Aspirin based products such as Excedrin, Goodys Powder, BC Powder.  Stop ANY OVER THE COUNTER supplements until after surgery beginning 02/14/21- CALCIUM + D3 , VITAMIN D3 , Omega-3 Fatty Acids  You may however, continue to take Tylenol if needed for pain up until the day of surgery.  No Alcohol for 24 hours before or after surgery.  No Smoking including e-cigarettes for 24 hours prior to surgery.  No chewable tobacco products for at least 6 hours prior to surgery.  No nicotine patches on the day of surgery.  Do not use any "recreational" drugs for at least a week prior to your surgery.  Please be advised that the combination of cocaine and anesthesia may have negative outcomes, up to and including death. If you test positive for cocaine, your surgery will be cancelled.  On the morning of surgery brush your teeth with toothpaste and water, you may rinse your mouth with  mouthwash if you wish. Do not swallow any toothpaste or mouthwash.  Take a fresh shower the morning of procedure, you may apply deodorant.  Do not wear jewelry, make-up, hairpins, clips or nail polish.  Do not wear lotions, powders, or perfumes.   Do not shave body from the neck down 48 hours prior to surgery just in case you cut yourself which could leave a site for infection.  Also, freshly shaved skin may become irritated if using the CHG soap.  Contact lenses, hearing aids and dentures may not be worn into surgery.  Do not bring valuables to the hospital. [facility] is not responsible for any missing/lost belongings or valuables.   Fleets enema or bowel prep as directed.  Notify your doctor if there is any change in your medical condition (cold, fever, infection).  Wear comfortable clothing (specific to your surgery type) to the hospital.  After surgery, you can help prevent lung complications by doing breathing exercises.  Take deep breaths and cough every 1-2 hours. Your doctor may order a device called an Incentive Spirometer to help you take deep breaths. When coughing or sneezing, hold a pillow firmly against your incision with both hands. This is called splinting. Doing this helps protect your incision. It also decreases belly discomfort.  If you are being admitted to the hospital overnight, leave your suitcase in the car. After surgery it may be brought to your room.  If you are being discharged the day of surgery, you will not be allowed to drive  home. You will need a responsible adult (18 years or older) to drive you home and stay with you that night.   If you are taking public transportation, you will need to have a responsible adult (18 years or older) with you. Please confirm with your physician that it is acceptable to use public transportation.   Please call the [facility] Dept. at [phone] if you have any questions about these  instructions.  Surgery Visitation Policy:  Patients undergoing a surgery or procedure may have one family member or support person with them as long as that person is not COVID-19 positive or experiencing its symptoms.  That person may remain in the waiting area during the procedure and may rotate out with other people.  Inpatient Visitation:    Visiting hours are 7 a.m. to 8 p.m. Up to two visitors ages 16+ are allowed at one time in a patient room. The visitors may rotate out with other people during the day. Visitors must check out when they leave, or other visitors will not be allowed. One designated support person may remain overnight. The visitor must pass COVID-19 screenings, use hand sanitizer when entering and exiting the patients room and wear a mask at all times, including in the patients room. Patients must also wear a mask when staff or their visitor are in the room. Masking is required regardless of vaccination status.

## 2021-02-12 NOTE — Progress Notes (Signed)
°  Perioperative Services Pre-Admission/Anesthesia Testing   Date: 02/12/21 Name: Raymond Michael MRN:   [id number]  Re: Consideration of preoperative prophylactic antibiotic change   Request sent to: Hollice Espy, MD (routed and/or faxed via [facility])  Planned Surgical Procedure(s):    Case: [id number] Date/Time: 02/22/21 0730   Procedure: RADIOACTIVE SEED IMPLANT/BRACHYTHERAPY IMPLANT   Anesthesia type: General   Pre-op diagnosis: .   Location: [facility] OR ROOM 10 / [place] ORS FOR ANESTHESIA GROUP   Surgeons: Hollice Espy, MD   Clinical Notes:  Patient has no documented allergy to PCN   Request:  As an evidence based approach to reducing the rate of incidence for post-operative SSI and the development of MDROs, could an agent with narrower coverage for preoperative prophylaxis in this patient's upcoming surgical course be considered?   Currently ordered preoperative prophylactic ABX: ciprofloxacin.   Specifically requesting change to cephalosporin (CEFAZOLIN).   Please communicate decision with me and I will change the orders in Epic as per your direction.      Citation: Beckie Salts, Wardell Heath et al: Best practice statement on urologic procedures and antimicrobial prophylaxis. J Urol 2020; 203: 351.   Honor Loh, MSN, APRN, FNP-C, C[facility]  Peri-operative Services Nurse Practitioner FAX: [phone] 02/12/21 12:02 PM

## 2021-02-17 ENCOUNTER — Ambulatory Visit
Admission: RE | Admit: 2021-02-17 | Discharge: 2021-02-17 | Disposition: A | Discharge status: Home / Self Care | Payer: 59 | Source: Ambulatory Visit | Attending: Radiation Oncology | Admitting: Radiation Oncology

## 2021-02-17 DIAGNOSIS — C61 Malignant neoplasm of prostate: Secondary | ICD-10-CM | POA: Insufficient documentation

## 2021-02-17 DIAGNOSIS — Z510 Encounter for antineoplastic radiation therapy: Secondary | ICD-10-CM | POA: Insufficient documentation

## 2021-02-17 NOTE — Addendum Note (Signed)
Addended by: Gerald Leitz A on: 02/17/2021 03:04 PM   Modules accepted: Orders

## 2021-02-19 ENCOUNTER — Telehealth: Payer: Self-pay | Admitting: Urology

## 2021-02-19 ENCOUNTER — Ambulatory Visit: Payer: PRIVATE HEALTH INSURANCE | Admitting: Urology

## 2021-02-19 ENCOUNTER — Other Ambulatory Visit: Payer: Self-pay

## 2021-02-19 ENCOUNTER — Other Ambulatory Visit
Admission: RE | Admit: 2021-02-19 | Discharge: 2021-02-19 | Disposition: A | Discharge status: Home / Self Care | Payer: 59 | Source: Ambulatory Visit | Attending: Urology | Admitting: Urology

## 2021-02-19 DIAGNOSIS — Z01812 Encounter for preprocedural laboratory examination: Secondary | ICD-10-CM | POA: Insufficient documentation

## 2021-02-19 DIAGNOSIS — I10 Essential (primary) hypertension: Secondary | ICD-10-CM | POA: Insufficient documentation

## 2021-02-19 DIAGNOSIS — E119 Type 2 diabetes mellitus without complications: Secondary | ICD-10-CM | POA: Insufficient documentation

## 2021-02-19 DIAGNOSIS — R54 Age-related physical debility: Secondary | ICD-10-CM | POA: Insufficient documentation

## 2021-02-19 LAB — CBC
HCT: 35.1 % — ABNORMAL LOW (ref 39.0–52.0)
Hemoglobin: 12 g/dL — ABNORMAL LOW (ref 13.0–17.0)
MCH: 31.7 pg (ref 26.0–34.0)
MCHC: 34.2 g/dL (ref 30.0–36.0)
MCV: 92.9 fL (ref 80.0–100.0)
Platelets: 153 10*3/uL (ref 150–400)
RBC: 3.78 MIL/uL — ABNORMAL LOW (ref 4.22–5.81)
RDW: 15.8 % — ABNORMAL HIGH (ref 11.5–15.5)
WBC: 2.8 10*3/uL — ABNORMAL LOW (ref 4.0–10.5)
nRBC: 0 % (ref 0.0–0.2)

## 2021-02-19 LAB — BASIC METABOLIC PANEL
Anion gap: 9 (ref 5–15)
BUN: 12 mg/dL (ref 8–23)
CO2: 26 mmol/L (ref 22–32)
Calcium: 8.9 mg/dL (ref 8.9–10.3)
Chloride: 102 mmol/L (ref 98–111)
Creatinine, Ser: 0.79 mg/dL (ref 0.61–1.24)
GFR, Estimated: >60 mL/min (ref >60)
Glucose, Bld: 312 mg/dL — ABNORMAL HIGH (ref 70–99)
Potassium: 4.1 mmol/L (ref 3.5–5.1)
Sodium: 137 mmol/L (ref 135–145)

## 2021-02-19 NOTE — Telephone Encounter (Signed)
Note opened in error.

## 2021-02-22 ENCOUNTER — Ambulatory Visit: Payer: 59

## 2021-02-22 ENCOUNTER — Ambulatory Visit
Admission: RE | Admit: 2021-02-22 | Discharge: 2021-02-22 | Disposition: A | Discharge status: Home / Self Care | Payer: 59 | Attending: Urology | Admitting: Urology

## 2021-02-22 ENCOUNTER — Encounter
Admission: RE | Disposition: A | Discharge status: Home / Self Care | Payer: Self-pay | Source: Home / Self Care | Attending: Urology

## 2021-02-22 ENCOUNTER — Ambulatory Visit: Payer: 59 | Attending: Radiation Oncology

## 2021-02-22 ENCOUNTER — Ambulatory Visit: Payer: 59 | Admitting: Urgent Care

## 2021-02-22 ENCOUNTER — Other Ambulatory Visit: Payer: Self-pay

## 2021-02-22 ENCOUNTER — Encounter: Payer: Self-pay | Admitting: Urology

## 2021-02-22 DIAGNOSIS — C61 Malignant neoplasm of prostate: Secondary | ICD-10-CM

## 2021-02-22 HISTORY — PX: RADIOACTIVE SEED IMPLANT: SHX5150

## 2021-02-22 LAB — GLUCOSE, CAPILLARY
Glucose-Capillary: 305 mg/dL — ABNORMAL HIGH (ref 70–99)
Glucose-Capillary: 258 mg/dL — ABNORMAL HIGH (ref 70–99)

## 2021-02-22 SURGERY — INSERTION, RADIATION SOURCE, PROSTATE
Anesthesia: General

## 2021-02-22 MED ORDER — PHENYLEPHRINE HCL-NACL 20-0.9 MG/250ML-% IV SOLN
INTRAVENOUS | Status: AC
  Filled 2021-02-22: qty 250

## 2021-02-22 MED ORDER — FLEET ENEMA 7-19 GM/118ML RE ENEM
1.0000 | ENEMA | Freq: Once | RECTAL | Status: AC
  Administered 2021-02-22: 1 via RECTAL

## 2021-02-22 MED ORDER — PROPOFOL 500 MG/50ML IV EMUL
INTRAVENOUS | Status: AC
  Filled 2021-02-22: qty 50

## 2021-02-22 MED ORDER — INSULIN ASPART 100 UNIT/ML IJ SOLN
5.0000 [IU] | Freq: Once | INTRAMUSCULAR | Status: AC
  Administered 2021-02-22: 5 [IU] via SUBCUTANEOUS

## 2021-02-22 MED ORDER — SUGAMMADEX SODIUM 200 MG/2ML IV SOLN
INTRAVENOUS | Status: DC | PRN
  Administered 2021-02-22: 200 mg via INTRAVENOUS

## 2021-02-22 MED ORDER — CHLORHEXIDINE GLUCONATE 0.12 % MT SOLN
OROMUCOSAL | Status: AC
  Administered 2021-02-22: 15 mL via OROMUCOSAL
  Filled 2021-02-22: qty 15

## 2021-02-22 MED ORDER — HYDROMORPHONE HCL 1 MG/ML IJ SOLN
INTRAMUSCULAR | Status: DC | PRN
  Administered 2021-02-22 (×2): .25 mg via INTRAVENOUS

## 2021-02-22 MED ORDER — INSULIN ASPART 100 UNIT/ML IJ SOLN
INTRAMUSCULAR | Status: AC
  Filled 2021-02-22: qty 1

## 2021-02-22 MED ORDER — CEFAZOLIN SODIUM-DEXTROSE 2-4 GM/100ML-% IV SOLN
INTRAVENOUS | Status: AC
  Filled 2021-02-22: qty 100

## 2021-02-22 MED ORDER — DEXMEDETOMIDINE HCL IN NACL 400 MCG/100ML IV SOLN
INTRAVENOUS | Status: DC | PRN
  Administered 2021-02-22: 12 ug via INTRAVENOUS

## 2021-02-22 MED ORDER — SUCCINYLCHOLINE CHLORIDE 200 MG/10ML IV SOSY
PREFILLED_SYRINGE | INTRAVENOUS | Status: DC | PRN
  Administered 2021-02-22: 140 mg via INTRAVENOUS

## 2021-02-22 MED ORDER — FENTANYL CITRATE (PF) 100 MCG/2ML IJ SOLN: 25.0000 ug | INTRAMUSCULAR | Status: DC | PRN

## 2021-02-22 MED ORDER — PROPOFOL 10 MG/ML IV BOLUS
INTRAVENOUS | Status: DC | PRN
Start: 2021-02-22 — End: 2021-02-22
  Administered 2021-02-22: 200 mg via INTRAVENOUS

## 2021-02-22 MED ORDER — HYDROMORPHONE HCL 1 MG/ML IJ SOLN
INTRAMUSCULAR | Status: AC
  Filled 2021-02-22: qty 1

## 2021-02-22 MED ORDER — BACITRACIN 500 UNIT/GM EX OINT
TOPICAL_OINTMENT | CUTANEOUS | Status: DC | PRN
  Administered 2021-02-22: 1 via TOPICAL

## 2021-02-22 MED ORDER — CHLORHEXIDINE GLUCONATE 0.12 % MT SOLN: 15.0000 mL | Freq: Once | OROMUCOSAL | Status: AC

## 2021-02-22 MED ORDER — MIDAZOLAM HCL 2 MG/2ML IJ SOLN
INTRAMUSCULAR | Status: DC | PRN
  Administered 2021-02-22: 2 mg via INTRAVENOUS

## 2021-02-22 MED ORDER — SODIUM CHLORIDE 0.9 % IV SOLN: INTRAVENOUS | Status: DC

## 2021-02-22 MED ORDER — ONDANSETRON HCL 4 MG/2ML IJ SOLN
INTRAMUSCULAR | Status: DC | PRN
  Administered 2021-02-22: 4 mg via INTRAVENOUS

## 2021-02-22 MED ORDER — ROCURONIUM BROMIDE 100 MG/10ML IV SOLN
INTRAVENOUS | Status: DC | PRN
Start: 2021-02-22 — End: 2021-02-22
  Administered 2021-02-22: 50 mg via INTRAVENOUS

## 2021-02-22 MED ORDER — FAMOTIDINE 20 MG PO TABS: 20.0000 mg | ORAL_TABLET | Freq: Once | ORAL | Status: AC

## 2021-02-22 MED ORDER — LIDOCAINE HCL (CARDIAC) PF 100 MG/5ML IV SOSY
PREFILLED_SYRINGE | INTRAVENOUS | Status: DC | PRN
  Administered 2021-02-22: 100 mg via INTRAVENOUS

## 2021-02-22 MED ORDER — KETOROLAC TROMETHAMINE 15 MG/ML IJ SOLN
INTRAMUSCULAR | Status: DC | PRN
  Administered 2021-02-22: 15 mg via INTRAVENOUS

## 2021-02-22 MED ORDER — BACITRACIN ZINC 500 UNIT/GM EX OINT
TOPICAL_OINTMENT | CUTANEOUS | Status: AC
  Filled 2021-02-22: qty 28.35

## 2021-02-22 MED ORDER — OXYCODONE HCL 5 MG PO TABS: 5.0000 mg | ORAL_TABLET | Freq: Once | ORAL | Status: DC | PRN

## 2021-02-22 MED ORDER — ORAL CARE MOUTH RINSE: 15.0000 mL | Freq: Once | OROMUCOSAL | Status: AC

## 2021-02-22 MED ORDER — MIDAZOLAM HCL 2 MG/2ML IJ SOLN
INTRAMUSCULAR | Status: AC
  Filled 2021-02-22: qty 2

## 2021-02-22 MED ORDER — OXYCODONE HCL 5 MG/5ML PO SOLN: 5.0000 mg | Freq: Once | ORAL | Status: DC | PRN

## 2021-02-22 MED ORDER — ACETAMINOPHEN 10 MG/ML IV SOLN: 1000.0000 mg | Freq: Once | INTRAVENOUS | Status: DC | PRN

## 2021-02-22 MED ORDER — FAMOTIDINE 20 MG PO TABS
ORAL_TABLET | ORAL | Status: AC
  Administered 2021-02-22: 20 mg via ORAL
  Filled 2021-02-22: qty 1

## 2021-02-22 MED ORDER — PROMETHAZINE HCL 25 MG/ML IJ SOLN: 6.2500 mg | INTRAMUSCULAR | Status: DC | PRN

## 2021-02-22 MED ORDER — CEFAZOLIN IN SODIUM CHLORIDE 3-0.9 GM/100ML-% IV SOLN
3.0000 g | Freq: Once | INTRAVENOUS | Status: AC
  Administered 2021-02-22: 3 g via INTRAVENOUS
  Filled 2021-02-22: qty 100

## 2021-02-22 MED ORDER — HYDROCODONE-ACETAMINOPHEN 5-325 MG PO TABS: 1.0000 | ORAL_TABLET | Freq: Four times a day (QID) | ORAL | 0 refills | Status: DC | PRN

## 2021-02-22 SURGICAL SUPPLY — 28 items
BAG DRN RND TRDRP ANRFLXCHMBR (UROLOGICAL SUPPLIES) ×1
BAG URINE DRAIN 2000ML AR STRL (UROLOGICAL SUPPLIES) ×3 IMPLANT
BLADE CLIPPER SURG (BLADE) ×3 IMPLANT
CATH FOL 2WAY LX 16X5 (CATHETERS) ×3 IMPLANT
COVER BACK TABLE REUSABLE LG (DRAPES) ×3 IMPLANT
DRAPE INCISE 23X17 IOBAN STRL (DRAPES) ×1
DRAPE INCISE 23X17 STRL (DRAPES) ×2 IMPLANT
DRAPE INCISE IOBAN 23X17 STRL (DRAPES) ×1 IMPLANT
DRAPE UNDER BUTTOCK W/FLU (DRAPES) ×3 IMPLANT
DRSG TELFA 3X8 NADH (GAUZE/BANDAGES/DRESSINGS) ×2 IMPLANT
GAUZE 4X4 16PLY ~~LOC~~+RFID DBL (SPONGE) ×6 IMPLANT
GLOVE SURG ENC MOIS LTX SZ6.5 (GLOVE) ×6 IMPLANT
GLOVE SURG ENC MOIS LTX SZ7.5 (GLOVE) ×6 IMPLANT
GOWN STRL REUS W/ TWL LRG LVL3 (GOWN DISPOSABLE) ×4 IMPLANT
GOWN STRL REUS W/ TWL XL LVL3 (GOWN DISPOSABLE) ×2 IMPLANT
GOWN STRL REUS W/TWL LRG LVL3 (GOWN DISPOSABLE) ×4
GOWN STRL REUS W/TWL XL LVL3 (GOWN DISPOSABLE) ×2
IV NS 1000ML (IV SOLUTION) ×2
IV NS 1000ML BAXH (IV SOLUTION) ×2 IMPLANT
KIT TURNOVER CYSTO (KITS) ×3 IMPLANT
MANIFOLD NEPTUNE II (INSTRUMENTS) ×2 IMPLANT
PACK CYSTO AR (MISCELLANEOUS) ×3 IMPLANT
PAD DRESSING TELFA 3X8 NADH (GAUZE/BANDAGES/DRESSINGS) ×2 IMPLANT
SET CYSTO W/LG BORE CLAMP LF (SET/KITS/TRAYS/PACK) ×3 IMPLANT
SURGILUBE 2OZ TUBE FLIPTOP (MISCELLANEOUS) ×3 IMPLANT
SYR 10ML LL (SYRINGE) ×3 IMPLANT
WATER STERILE IRR 1000ML POUR (IV SOLUTION) ×3 IMPLANT
WATER STERILE IRR 500ML POUR (IV SOLUTION) ×3 IMPLANT

## 2021-02-22 NOTE — Anesthesia Postprocedure Evaluation (Signed)
Anesthesia Post Note  Patient: Raymond Michael  Procedure(s) Performed: RADIOACTIVE SEED IMPLANT/BRACHYTHERAPY IMPLANT  Patient location during evaluation: PACU Anesthesia Type: General Level of consciousness: awake and alert Pain management: pain level controlled Vital Signs Assessment: post-procedure vital signs reviewed and stable Respiratory status: spontaneous breathing, nonlabored ventilation and respiratory function stable Cardiovascular status: blood pressure returned to baseline and stable Postop Assessment: no apparent nausea or vomiting Anesthetic complications: no Comments: Discharge with hyperglycemia. Pt is aware. States he has not been taking his oral medications.    No notable events documented.   Last Vitals:  Vitals:   02/22/21 0646 02/22/21 0857  BP: 134/83 134/81  Pulse: 76 73  Resp: 18 17  Temp: (!) 36.2 C (!) 36.4 C  SpO2: 96% 96%    Last Pain:  Vitals:   02/22/21 0857  TempSrc:   PainSc: Asleep                 [place] Ouch

## 2021-02-22 NOTE — Op Note (Signed)
Preoperative diagnosis: Adenocarcinoma of the prostate   Postoperative diagnosis: Same   Procedure: I-125 prostate seed implantation, cystoscopy  Surgeon: Hollice Espy M.D.   Radiation Oncology: Lavena Stanford, M.D.   Anesthesia: General  Drains: none  Complications: none  Indications: Gleason 4+3 prostate cancer  Procedure: Patient was brought to operating suite and placement table in the supine position. At this time, a universal timeout protocol was performed, all team members were identified, Venodyne boots are placed, and he was administered IV Ancef in the preoperative period. He was placed in lithotomy position and prepped and draped in usual manner. Radiation oncology department placed a transrectal ultrasound probe anchoring stand/ grid and aligned with previous imaging from the volume study. Foley catheter was inserted without difficulty.  All needle passage was done with real-time transrectal ultrasound guidance in both the transverse and sagittal plains in order to achieve the desired preplanned position. A total of 30 needles were placed.  100 active seeds were implanted. The Foley catheter was removed and a rigid cystoscopy failed to show any seeds outside the prostate without evidence of trauma to the urethral, prostatic fossa, or bladder.  The bladder was drained.  A fluoroscopic image was then obtained showing excellent distrubution of the brachytherapy seeds.  Each seed was counted and counts were correct.    The patient was then repositioned in the supine position, reversed from anesthesia, and taken to the PACU in stable condition.  Plan for f/u in 4 weeks to assess voiding symptoms

## 2021-02-22 NOTE — Discharge Instructions (Signed)

## 2021-02-22 NOTE — Transfer of Care (Addendum)
Immediate Anesthesia Transfer of Care Note  Patient: Raymond Michael  Procedure(s) Performed: RADIOACTIVE SEED IMPLANT/BRACHYTHERAPY IMPLANT  Patient Location: PACU  Anesthesia Type:General  Level of Consciousness: drowsy  Airway & Oxygen Therapy: Patient connected to face mask  Post-op Assessment: Report given to RN  Post vital signs: stable  Last Vitals:  Vitals Value Taken Time  BP    Temp    Pulse    Resp    SpO2      Last Pain:  Vitals:   02/22/21 0646  TempSrc: Temporal  PainSc: 0-No pain         Complications: No notable events documented.

## 2021-02-22 NOTE — Anesthesia Procedure Notes (Signed)
Procedure Name: Intubation Date/Time: 02/22/2021 7:54 AM Performed by: Kerri Perches, CRNA Pre-anesthesia Checklist: Patient identified, Patient being monitored, Timeout performed, Emergency Drugs available and Suction available Patient Re-evaluated:Patient Re-evaluated prior to induction Oxygen Delivery Method: Circle system utilized Preoxygenation: Pre-oxygenation with 100% oxygen Induction Type: IV induction Ventilation: Mask ventilation without difficulty Laryngoscope Size: 3 and McGraph Grade View: Grade I Tube type: Oral Tube size: 7.0 mm Number of attempts: 1 Airway Equipment and Method: Stylet Placement Confirmation: ETT inserted through vocal cords under direct vision, positive ETCO2 and breath sounds checked- equal and bilateral Secured at: 21 cm Tube secured with: Tape Dental Injury: Teeth and Oropharynx as per pre-operative assessment

## 2021-02-22 NOTE — Progress Notes (Signed)
Radiation Oncology I-125 interstitial implant note  Name: Raymond Michael   Date:   01/01/2021 MRN:  [id number] DOB: 10-30-1955    This 66 y.o. 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 provider found  HPI: Patient is a 66 year old male who is completed external beam radiation therapy to his prostate and pelvic nodes for stage IIIa adenocarcinoma the prostate Gleason 7 (4+3 presenting with a PSA in the 90 range.  He is also been started on ADT therapy..  COMPLICATIONS OF TREATMENT: none  FOLLOW UP COMPLIANCE: keeps appointments   PHYSICAL EXAM:  BP 132/86    Pulse 66    Temp (!) 97.2 F (36.2 C) (Temporal)    Resp 18    Ht 5\' 10"  (1.778 m)    Wt 280 lb (127 kg)    SpO2 96%    BMI 40.18 kg/m  Well-developed well-nourished patient in NAD. HEENT reveals PERLA, EOMI, discs not visualized.  Oral cavity is clear. No oral mucosal lesions are identified. Neck is clear without evidence of cervical or supraclavicular adenopathy. Lungs are clear to A&P. Cardiac examination is essentially unremarkable with regular rate and rhythm without murmur rub or thrill. Abdomen is benign with no organomegaly or masses noted. Motor sensory and DTR levels are equal and symmetric in the upper and lower extremities. Cranial nerves II through XII are grossly intact. Proprioception is intact. No peripheral adenopathy or edema is identified. No motor or sensory levels are noted. Crude visual fields are within normal range.  RADIOLOGY RESULTS: Ultrasound used for source placement  PLAN: Patient was taken to the operating room and general anesthesia was administered. Legs were immobilized in stirrups and patient was positioned in the exact same proportions as original volume study. Patient was prepped and Foley catheter was placed. Ultrasound guidance identified the prostate  and recreated the original set up as per treatment planning volume study.  A needle grid was attached to the ultrasound probe to position the needles.  30 needles were placed under ultrasound guidance  to the  prostate PTV  . After completion of procedure cystoscopy was performed by urology and no evidence of seeds in the bladder were noted. Patient tolerated the procedure extremely well. Initial plain film as doublecheck identified 100 seeds in the prostate. Patient has followup appointment in one month for CT scan for quality assurance will be performed.Prior to implant 10% or 10 loose seeds which is ever higher were ordered and assayed to verify source strength.     Noreene Filbert, MD

## 2021-02-22 NOTE — Interval H&P Note (Signed)
History and Physical Interval Note:  02/22/2021 7:24 AM  Raymond Michael  has presented today for surgery, with the diagnosis of .[name]  The various methods of treatment have been discussed with the patient and family. After consideration of risks, benefits and other options for treatment, the patient has consented to  Procedure(s): RADIOACTIVE SEED IMPLANT/BRACHYTHERAPY IMPLANT (N/A) as a surgical intervention.  The patient's history has been reviewed, patient examined, no change in status, stable for surgery.  I have reviewed the patient's chart and labs.  Questions were answered to the patient's satisfaction.    RRR CTAB   Hollice Espy

## 2021-02-22 NOTE — Anesthesia Preprocedure Evaluation (Addendum)
Anesthesia Evaluation  Patient identified by MRN, date of birth, ID band Patient awake    Reviewed: Allergy & Precautions, NPO status , Patient's Chart, lab work & pertinent test results  Airway Mallampati: III  TM Distance: >3 FB Neck ROM: Full    Dental  (+) Implants   Pulmonary sleep apnea ,    Pulmonary exam normal breath sounds clear to auscultation       Cardiovascular Exercise Tolerance: Poor METS: 3 - Mets Normal cardiovascular exam Rhythm:Regular Rate:Normal     Neuro/Psych negative neurological ROS  negative psych ROS   GI/Hepatic negative GI ROS, Neg liver ROS, neg GERD  ,  Endo/Other  diabetes (Last A1C 10.9, Hyperglycemia today), Poorly Controlled, Type 2, Oral Hypoglycemic Agents  Renal/GU negative Renal ROS  negative genitourinary   Musculoskeletal  (+) Arthritis ,   Abdominal (+) + obese,   Peds negative pediatric ROS (+)  Hematology negative hematology ROS (+)   Anesthesia Other Findings   Reproductive/Obstetrics negative OB ROS                            Anesthesia Physical Anesthesia Plan  ASA: 3  Anesthesia Plan: General ETT   Post-op Pain Management:    Induction: Intravenous  PONV Risk Score and Plan: 2 and Ondansetron, Dexamethasone and Midazolam  Airway Management Planned: Oral ETT  Additional Equipment:   Intra-op Plan:   Post-operative Plan: Extubation in OR  Informed Consent: I have reviewed the patients History and Physical, chart, labs and discussed the procedure including the risks, benefits and alternatives for the proposed anesthesia with the patient or authorized representative who has indicated his/her understanding and acceptance.     Dental Advisory Given  Plan Discussed with: Anesthesiologist, CRNA and Surgeon  Anesthesia Plan Comments: (Treated hyperglycemia with subQ insulin. Pt denies signs of HHS.   Patient consented for risks  of anesthesia including but not limited to:  - adverse reactions to medications - damage to eyes, teeth, lips or other oral mucosa - nerve damage due to positioning  - sore throat or hoarseness - Damage to heart, brain, nerves, lungs, other parts of body or loss of life  Patient voiced understanding.)       Anesthesia Quick Evaluation

## 2021-02-23 ENCOUNTER — Encounter: Payer: Self-pay | Admitting: Urology

## 2021-02-23 ENCOUNTER — Ambulatory Visit: Payer: 59 | Admitting: Urology

## 2021-02-23 NOTE — Progress Notes (Incomplete)
02/23/21 2:29 PM   Raymond Michael [birth year] [id number]  Referring provider:  [facility], [facility] [address] [place],  [place] [address] No chief complaint on file.    HPI: Raymond Michael is a 66 y.o.male with a personal history of  prostate cancer, who presents today for further evaluation of post-op dribbling.   In the interim, he is undergone staging CT abdomen pelvis as well as bone scan both of which are negative for any evidence of metastatic disease.   PET scan and PSMA were negative.   He is s/p I-125 prostate seed implantation on 02/24/2020.       PMH: Past Medical History:  Diagnosis Date   Arthritis    Cancer ([place])    Diabetes ([place])    GERD (gastroesophageal reflux disease)    Hyperlipidemia     Surgical History: Past Surgical History:  Procedure Laterality Date   COLONOSCOPY     NOSE SURGERY     RADIOACTIVE SEED IMPLANT N/A 02/22/2021   Procedure: RADIOACTIVE SEED IMPLANT/BRACHYTHERAPY IMPLANT;  Surgeon: Hollice Espy, MD;  Location: [facility] ORS;  Service: Urology;  Laterality: N/A;   TONSILLECTOMY AND ADENOIDECTOMY      Home Medications:  Allergies as of 02/23/2021   No Known Allergies      Medication List        Accurate as of February 23, 2021  2:29 PM. If you have any questions, ask your nurse or doctor.          CALCIUM + D3 PO Take 2 tablets by mouth in the morning.   clobetasol cream 0.05 % Commonly known as: TEMOVATE Apply 1 application topically 2 (two) times daily as needed.   diclofenac 50 MG EC tablet Commonly known as: VOLTAREN Take 50 mg by mouth See admin instructions. Take 2 tablets (100 mg) by mouth in the morning & take 1 tablet (50 mg) by mouth at night.   diclofenac 50 MG tablet Commonly known as: CATAFLAM Take 50-100 mg by mouth See admin instructions. Take 2 tablets (100 mg) by mouth in the morning & take 1 tablet (50 mg) by mouth in the evening.   FISH OIL PO Take 2 capsules by mouth in the morning  and at bedtime.   gabapentin 100 MG capsule Commonly known as: NEURONTIN Take 1 capsule by mouth twice daily   glipiZIDE 5 MG tablet Commonly known as: GLUCOTROL TAKE 1 TABLET BY MOUTH ONCE DAILY BEFORE BREAKFAST   HYDROcodone-acetaminophen 5-325 MG tablet Commonly known as: NORCO/VICODIN Take 1-2 tablets by mouth every 6 (six) hours as needed for moderate pain.   ibuprofen 200 MG tablet Commonly known as: ADVIL Take 400 mg by mouth every 8 (eight) hours as needed (for pain.).   metFORMIN 1000 MG tablet Commonly known as: GLUCOPHAGE TAKE 1 TABLET BY MOUTH TWICE DAILY WITH MEALS . APPOINTMENT REQUIRED FOR FUTURE REFILLS   naproxen 500 MG tablet Commonly known as: NAPROSYN TAKE 1 TABLET BY MOUTH TWICE A DAY AS NEEDED   pioglitazone 15 MG tablet Commonly known as: ACTOS TAKE 1 TABLET BY MOUTH EVERY DAY   rosuvastatin 10 MG tablet Commonly known as: CRESTOR TAKE 1 TABLET BY MOUTH EVERY DAY   Rybelsus 7 MG Tabs Generic drug: Semaglutide PLEASE SEE ATTACHED FOR DETAILED DIRECTIONS   sildenafil 100 MG tablet Commonly known as: VIAGRA Take 1 tablet (100 mg total) by mouth daily as needed for erectile dysfunction. Take one tab one hour prior to intercourse   tamsulosin 0.4 MG  Caps capsule Commonly known as: FLOMAX Take 1 capsule (0.4 mg total) by mouth daily.   VITAMIN D3 PO Take 1 tablet by mouth in the morning.   Xeljanz 5 MG Tabs Generic drug: Tofacitinib Citrate Take 5 mg by mouth 2 (two) times daily.        Allergies: No Known Allergies  Family History: Family History  Problem Relation Age of Onset   Diabetes Father    Diabetes Brother     Social History:  reports that he has never smoked. He has never used smokeless tobacco. He reports current alcohol use. He reports that he does not use drugs.   Physical Exam: There were no vitals taken for this visit.  Constitutional:  Alert and oriented, No acute distress. HEENT: [place] AT, moist mucus membranes.   Trachea midline, no masses. Cardiovascular: No clubbing, cyanosis, or edema. Respiratory: Normal respiratory effort, no increased work of breathing. Skin: No rashes, bruises or suspicious lesions. Neurologic: Grossly intact, no focal deficits, moving all 4 extremities. Psychiatric: Normal mood and affect.  Laboratory Data:  Lab Results  Component Value Date   CREATININE 0.79 02/19/2021    Lab Results  Component Value Date   TESTOSTERONE 509 11/06/2020    Lab Results  Component Value Date   HGBA1C 10.9 (A) 04/08/2019    Urinalysis   Pertinent Imaging:    Assessment & Plan:     No follow-ups on file.  I,Kailey Littlejohn,acting as a Education administrator for Hollice Espy, MD.,have documented all relevant documentation on the behalf of Hollice Espy, MD,as directed by  Hollice Espy, MD while in the presence of Hollice Espy, [facility] [address], [place], [place] [address] [phone]

## 2021-02-25 ENCOUNTER — Encounter: Payer: Self-pay | Admitting: Urology

## 2021-02-25 ENCOUNTER — Ambulatory Visit (INDEPENDENT_AMBULATORY_CARE_PROVIDER_SITE_OTHER): Payer: 59 | Admitting: Urology

## 2021-02-25 ENCOUNTER — Other Ambulatory Visit: Payer: Self-pay

## 2021-02-25 ENCOUNTER — Telehealth: Payer: Self-pay | Admitting: *Deleted

## 2021-02-25 DIAGNOSIS — R39198 Other difficulties with micturition: Secondary | ICD-10-CM | POA: Diagnosis not present

## 2021-02-25 LAB — BLADDER SCAN AMB NON-IMAGING: Scan Result: 99

## 2021-02-25 NOTE — Progress Notes (Signed)
02/25/2021 9:35 PM   [name] December 23, 1955 [id number]  Referring provider: [facility], [facility] [address] [place],  [place] [address]  No chief complaint on file.  Urological history 1. Prostate cancer -PSA -iPSA 93.51  -prostate biopsy 11/2020 - pathology showed multiple cores bilaterally of Gleason 4+3, 3+4 and 3+3.  Large volume up to 86% -CT abdomen pelvis as well as bone scan 11/2020 both of which are negative for any evidence of metastatic disease -PET scan and PSMA 11/2020 - negative -started on ADT 12/22/2020 -prostate seed implantation 02/2021  2. ED -contributing factors of age, BPH, prostate cancer, diabetes, HLD and obesity -managed with sildenafil 100 mg, on-demand-dosing  3. BPH with LU TS -managed with tamsulosin 0.4 mg daily    HPI: CELSO GRANJA is a 66 y.o. male who presents today for difficulty urinating.  He underwent prostate seed implantation on 02/22/2021 and has been experiencing a decreased urinary flow and some mild pain.  He is wanting to have his bladder scanned to ensure he is not in retention.   PVR 99 mL  PMH: Past Medical History:  Diagnosis Date   Arthritis    Cancer ([place])    Diabetes ([place])    GERD (gastroesophageal reflux disease)    Hyperlipidemia     Surgical History: Past Surgical History:  Procedure Laterality Date   COLONOSCOPY     NOSE SURGERY     RADIOACTIVE SEED IMPLANT N/A 02/22/2021   Procedure: RADIOACTIVE SEED IMPLANT/BRACHYTHERAPY IMPLANT;  Surgeon: Hollice Espy, MD;  Location: [facility] ORS;  Service: Urology;  Laterality: N/A;   TONSILLECTOMY AND ADENOIDECTOMY      Home Medications:  Allergies as of 02/25/2021   No Known Allergies      Medication List        Accurate as of February 25, 2021  9:35 PM. If you have any questions, ask your nurse or doctor.          CALCIUM + D3 PO Take 2 tablets by mouth in the morning.   clobetasol cream 0.05 % Commonly known as: TEMOVATE Apply 1  application topically 2 (two) times daily as needed.   diclofenac 50 MG EC tablet Commonly known as: VOLTAREN Take 50 mg by mouth See admin instructions. Take 2 tablets (100 mg) by mouth in the morning & take 1 tablet (50 mg) by mouth at night.   diclofenac 50 MG tablet Commonly known as: CATAFLAM Take 50-100 mg by mouth See admin instructions. Take 2 tablets (100 mg) by mouth in the morning & take 1 tablet (50 mg) by mouth in the evening.   FISH OIL PO Take 2 capsules by mouth in the morning and at bedtime.   gabapentin 100 MG capsule Commonly known as: NEURONTIN Take 1 capsule by mouth twice daily   glipiZIDE 5 MG tablet Commonly known as: GLUCOTROL TAKE 1 TABLET BY MOUTH ONCE DAILY BEFORE BREAKFAST   HYDROcodone-acetaminophen 5-325 MG tablet Commonly known as: NORCO/VICODIN Take 1-2 tablets by mouth every 6 (six) hours as needed for moderate pain.   ibuprofen 200 MG tablet Commonly known as: ADVIL Take 400 mg by mouth every 8 (eight) hours as needed (for pain.).   metFORMIN 1000 MG tablet Commonly known as: GLUCOPHAGE TAKE 1 TABLET BY MOUTH TWICE DAILY WITH MEALS . APPOINTMENT REQUIRED FOR FUTURE REFILLS   naproxen 500 MG tablet Commonly known as: NAPROSYN TAKE 1 TABLET BY MOUTH TWICE A DAY AS NEEDED   pioglitazone 15 MG tablet Commonly known  as: ACTOS TAKE 1 TABLET BY MOUTH EVERY DAY   rosuvastatin 10 MG tablet Commonly known as: CRESTOR TAKE 1 TABLET BY MOUTH EVERY DAY   Rybelsus 7 MG Tabs Generic drug: Semaglutide PLEASE SEE ATTACHED FOR DETAILED DIRECTIONS   sildenafil 100 MG tablet Commonly known as: VIAGRA Take 1 tablet (100 mg total) by mouth daily as needed for erectile dysfunction. Take one tab one hour prior to intercourse   tamsulosin 0.4 MG Caps capsule Commonly known as: FLOMAX Take 1 capsule (0.4 mg total) by mouth daily.   VITAMIN D3 PO Take 1 tablet by mouth in the morning.   Xeljanz 5 MG Tabs Generic drug: Tofacitinib Citrate Take 5 mg  by mouth 2 (two) times daily.        Allergies: No Known Allergies  Family History: Family History  Problem Relation Age of Onset   Diabetes Father    Diabetes Brother     Social History:  reports that he has never smoked. He has never used smokeless tobacco. He reports current alcohol use. He reports that he does not use drugs.  ROS: Pertinent ROS in HPI  Physical Exam: Constitutional:  Well nourished. Alert and oriented, No acute distress. HEENT: [place] AT, mask in place.  Trachea midline Cardiovascular: No clubbing, cyanosis, or edema. Respiratory: Normal respiratory effort, no increased work of breathing. Neurologic: Grossly intact, no focal deficits, moving all 4 extremities. Psychiatric: Normal mood and affect.  Laboratory Data: Lab Results  Component Value Date   WBC 2.8 (L) 02/19/2021   HGB 12.0 (L) 02/19/2021   HCT 35.1 (L) 02/19/2021   MCV 92.9 02/19/2021   PLT 153 02/19/2021    Lab Results  Component Value Date   CREATININE 0.79 02/19/2021    Lab Results  Component Value Date   TESTOSTERONE 509 11/06/2020    Urinalysis    Component Value Date/Time   COLORURINE YELLOW 11/06/2020 0848   APPEARANCEUR CLEAR 11/06/2020 0848   LABSPEC >1.030 (H) 11/06/2020 0848   PHURINE 5.0 11/06/2020 0848   GLUCOSEU NEGATIVE 11/06/2020 0848   HGBUR NEGATIVE 11/06/2020 0848   BILIRUBINUR NEGATIVE 11/06/2020 0848   BILIRUBINUR neg 07/30/2015 0945   KETONESUR TRACE (A) 11/06/2020 0848   PROTEINUR 30 (A) 11/06/2020 0848   UROBILINOGEN negative 07/30/2015 0945   NITRITE NEGATIVE 11/06/2020 0848   LEUKOCYTESUR NEGATIVE 11/06/2020 0848  I have reviewed the labs.   Pertinent Imaging:  02/25/21 16:49  Scan Result 99 ml   Assessment & Plan:    1. Difficulty urinating -BLADDER SCAN AMB NON-IMAGING -PVR is not high enough to warrant catheter placement  -continue tamsulosin 0.4 mg  Return for follow up with Dr. Erlene Quan as scheduled on 03/23/2021 .  These notes  generated with voice recognition software. I apologize for typographical errors.  [name], PA-C  [facility] [address]  [place], [place] [address] [phone]

## 2021-02-25 NOTE — Telephone Encounter (Signed)
Patient left VM-returned call unable to reach patient left VM for patient to return call

## 2021-02-25 NOTE — Telephone Encounter (Signed)
Patient returned call-has been having some symptoms of decreased urinary flow, some mild pain. Offered to schedule same day appointment for an evaluation, patient declined. Reviewed symptoms of urinary retention and UTI to be aware of and to seek medical attention.  Voiced understanding to seek medical attention if symptoms worsen.

## 2021-03-16 ENCOUNTER — Telehealth: Payer: Self-pay | Admitting: Urology

## 2021-03-16 NOTE — Telephone Encounter (Signed)
Patient called and stated that he would like a call back in reference to questions he has about his surgery.  Patient did not want to go in detail with me.

## 2021-03-17 NOTE — Telephone Encounter (Signed)
Spoke with patient, still having ongoing leakage. States symptoms have been improving. Will follow up as scheduled for post op.Voiced understanding.

## 2021-03-22 ENCOUNTER — Encounter: Payer: Self-pay | Admitting: Radiation Oncology

## 2021-03-22 ENCOUNTER — Ambulatory Visit
Admission: RE | Admit: 2021-03-22 | Discharge: 2021-03-22 | Disposition: A | Discharge status: Home / Self Care | Payer: 59 | Source: Ambulatory Visit | Attending: Radiation Oncology | Admitting: Radiation Oncology

## 2021-03-22 ENCOUNTER — Other Ambulatory Visit: Payer: Self-pay

## 2021-03-22 VITALS — BP 147/80 | HR 85 | Temp 96.7°F | Resp 18 | Ht 73.0 in | Wt 294.8 lb

## 2021-03-22 DIAGNOSIS — C61 Malignant neoplasm of prostate: Secondary | ICD-10-CM | POA: Insufficient documentation

## 2021-03-22 NOTE — Progress Notes (Signed)
03/23/21 1:00 PM   Raymond Michael 1955/08/27 [id number]  Referring provider:  [facility] [address] [place],  [name] [address] Chief Complaint  Patient presents with   Prostate Cancer     HPI: Raymond Michael is a 66 y.o.male with a personal history of prostate cancer, ED, and BPH with LUTS, who present today for 4 week follow-up on voiding symptoms.   Pathology revealec multiple cores bilaterally of Gleason 4+3, 3+4 and 3+3.  Large volume up to 86%.  CT abdomen pelvis as well as bone scan 11/2020 both of which are negative for any evidence of metastatic disease  PET scan and PSMA on 11/2020 were negative.   He was started on ADT on 12/22/2020. He is s/p prostate seed implantation on 02/2021.   He was last seen in clinic on 02/25/2021 with [name], PA-C, for decreased urinary flow and mild pain after implantation. PVR was 99 ml.   He denies burning during urination and blood in his urine. He reports that he has had constipation.   Today, he continues to have severe urgency frequency and occasional urge incontinence.  This is becoming increasingly bothersome to him.  When he is to travel for work, he sometimes has accidents and is now having to wear depends.  He saw Dr. Donella Stade yesterday and was told that this is a normal.  IPSS as below.  Emptying improved today, PVR 51 cc.   IPSS     Row Name 03/23/21 1000         International Prostate Symptom Score   How often have you had the sensation of not emptying your bladder? Almost always     How often have you had to urinate less than every two hours? About half the time     How often have you found you stopped and started again several times when you urinated? More than half the time     How often have you found it difficult to postpone urination? Almost always     How often have you had a weak urinary stream? Almost always     How often have you had to strain to start urination? About half the  time     How many times did you typically get up at night to urinate? 3 Times     Total IPSS Score 28       Quality of Life due to urinary symptoms   If you were to spend the rest of your life with your urinary condition just the way it is now how would you feel about that? Unhappy              Score:  1-7 Mild 8-19 Moderate 20-35 Severe   PMH: Past Medical History:  Diagnosis Date   Arthritis    Cancer ([place])    Diabetes ([place])    GERD (gastroesophageal reflux disease)    Hyperlipidemia     Surgical History: Past Surgical History:  Procedure Laterality Date   COLONOSCOPY     NOSE SURGERY     RADIOACTIVE SEED IMPLANT N/A 02/22/2021   Procedure: RADIOACTIVE SEED IMPLANT/BRACHYTHERAPY IMPLANT;  Surgeon: Hollice Espy, MD;  Location: [facility] ORS;  Service: Urology;  Laterality: N/A;   TONSILLECTOMY AND ADENOIDECTOMY      Home Medications:  Allergies as of 03/23/2021   No Known Allergies      Medication List        Accurate as of March 23, 2021  1:00 PM.  If you have any questions, ask your nurse or doctor.          CALCIUM + D3 PO Take 2 tablets by mouth in the morning.   clobetasol cream 0.05 % Commonly known as: TEMOVATE Apply 1 application topically 2 (two) times daily as needed.   diclofenac 50 MG EC tablet Commonly known as: VOLTAREN Take 50 mg by mouth See admin instructions. Take 2 tablets (100 mg) by mouth in the morning & take 1 tablet (50 mg) by mouth at night.   diclofenac 50 MG tablet Commonly known as: CATAFLAM Take 50-100 mg by mouth See admin instructions. Take 2 tablets (100 mg) by mouth in the morning & take 1 tablet (50 mg) by mouth in the evening.   docusate sodium 100 MG capsule Commonly known as: Colace Take 1 capsule (100 mg total) by mouth 2 (two) times daily.   FISH OIL PO Take 2 capsules by mouth in the morning and at bedtime.   gabapentin 100 MG capsule Commonly known as: NEURONTIN Take 1 capsule by mouth twice daily    glipiZIDE 5 MG tablet Commonly known as: GLUCOTROL TAKE 1 TABLET BY MOUTH ONCE DAILY BEFORE BREAKFAST   HYDROcodone-acetaminophen 5-325 MG tablet Commonly known as: NORCO/VICODIN Take 1-2 tablets by mouth every 6 (six) hours as needed for moderate pain.   ibuprofen 200 MG tablet Commonly known as: ADVIL Take 400 mg by mouth every 8 (eight) hours as needed (for pain.).   metFORMIN 1000 MG tablet Commonly known as: GLUCOPHAGE TAKE 1 TABLET BY MOUTH TWICE DAILY WITH MEALS . APPOINTMENT REQUIRED FOR FUTURE REFILLS   naproxen 500 MG tablet Commonly known as: NAPROSYN TAKE 1 TABLET BY MOUTH TWICE A DAY AS NEEDED   oxybutynin 10 MG 24 hr tablet Commonly known as: DITROPAN-XL Take 1 tablet (10 mg total) by mouth daily.   pioglitazone 15 MG tablet Commonly known as: ACTOS TAKE 1 TABLET BY MOUTH EVERY DAY   rosuvastatin 10 MG tablet Commonly known as: CRESTOR TAKE 1 TABLET BY MOUTH EVERY DAY   Rybelsus 7 MG Tabs Generic drug: Semaglutide PLEASE SEE ATTACHED FOR DETAILED DIRECTIONS   sildenafil 100 MG tablet Commonly known as: VIAGRA Take 1 tablet (100 mg total) by mouth daily as needed for erectile dysfunction. Take one tab one hour prior to intercourse   tamsulosin 0.4 MG Caps capsule Commonly known as: FLOMAX Take 1 capsule (0.4 mg total) by mouth daily.   VITAMIN D3 PO Take 1 tablet by mouth in the morning.   Xeljanz 5 MG Tabs Generic drug: Tofacitinib Citrate Take 5 mg by mouth 2 (two) times daily.        Allergies: No Known Allergies  Family History: Family History  Problem Relation Age of Onset   Diabetes Father    Diabetes Brother     Social History:  reports that he has never smoked. He has never used smokeless tobacco. He reports current alcohol use. He reports that he does not use drugs.   Physical Exam: There were no vitals taken for this visit.  Constitutional:  Alert and oriented, No acute distress. HEENT: [place] AT, moist mucus membranes.   Trachea midline, no masses. Cardiovascular: No clubbing, cyanosis, or edema. Respiratory: Normal respiratory effort, no increased work of breathing. Skin: No rashes, bruises or suspicious lesions. Neurologic: Grossly intact, no focal deficits, moving all 4 extremities. Psychiatric: Normal mood and affect.  Laboratory Data:  Lab Results  Component Value Date   CREATININE 0.79 02/19/2021  Lab Results  Component Value Date   TESTOSTERONE 509 11/06/2020   Lab Results  Component Value Date   HGBA1C 10.9 (A) 04/08/2019   Pertinent Imaging: Results for orders placed or performed in visit on 03/23/21  Bladder Scan (Post Void Residual) in office  Result Value Ref Range   Scan Result 51      Assessment & Plan:    Urinary hesitancy/ frequency - We discussed oxybutynin 10 mg XL today now that he is emptying little better, discussed possible side effects including dry eyes, dry mouth, constipation, and urinary retention. - He is emptying adequately today but continues to have increased urinary symptoms  -Continue Flomax 0.4 mg - BLADDER SCAN AMB NON-IMAGING - Will recheck for symptoms in 3 months  2. Prostate cancer [facility])  - s/p Brachytherapy seed implantation  - due for PSA in next 3 months  - he is current on ADT will continue this for 2-3 years in setting of high risk cancer based on his preoperative PSA - Due for Eligard in May   2. Constipation - He has had ongoing constipation; we discussed how this can cause increased urinary symptoms  - Prescribed Colace to take as needed for a bowel movement   Follow-up in 3 months for symptom recheck, we will try to coordinate on the same day as Dr. Olena Leatherwood appointment  Conley Rolls as a scribe for Hollice Espy, MD.,have documented all relevant documentation on the behalf of Hollice Espy, MD,as directed by  Hollice Espy, MD while in the presence of Hollice Espy, MD.  I have reviewed the above documentation for  accuracy and completeness, and I agree with the above.   Hollice Espy, MD  [facility] [address] [place], [place] [address] [phone]

## 2021-03-22 NOTE — Progress Notes (Signed)
Radiation Oncology Follow up Note  Name: Raymond Michael   Date:   03/22/2021 MRN:  [id number] DOB: 1955-05-11    This 66 y.o. male presents to the clinic today for 1 month follow-up status post I-125 interstitial him for for boost in patient with 6 Gleason 7 (4+3) adenocarcinoma the prostate presenting with a PSA of 93.[name]  REFERRING PROVIDER: [facility]  HPI: Patient is a 66 year old male previously treated to his prostate and pelvic nodes is now 1 month out from I-125 interstitial implant for boost.  His PSA was in the 93 range with a Gleason 7 and 11 of 12 cores adenocarcinoma.  Seen today in follow-up he is doing fairly well still has some urgency frequency of urination and does leak is wearing a pad at this time.  He is having no bowel complaints.  He is currently on Flomax taking that once a day..  COMPLICATIONS OF TREATMENT: none  FOLLOW UP COMPLIANCE: keeps appointments   PHYSICAL EXAM:  There were no vitals taken for this visit. Well-developed well-nourished patient in NAD. HEENT reveals PERLA, EOMI, discs not visualized.  Oral cavity is clear. No oral mucosal lesions are identified. Neck is clear without evidence of cervical or supraclavicular adenopathy. Lungs are clear to A&P. Cardiac examination is essentially unremarkable with regular rate and rhythm without murmur rub or thrill. Abdomen is benign with no organomegaly or masses noted. Motor sensory and DTR levels are equal and symmetric in the upper and lower extremities. Cranial nerves II through XII are grossly intact. Proprioception is intact. No peripheral adenopathy or edema is identified. No motor or sensory levels are noted. Crude visual fields are within normal range.  RADIOLOGY RESULTS: CT scan for source placement verification reviewed and performed showing excellent source placement  PLAN: Present time patient is doing well he has some moderate lower urinary tract symptoms such as frequency urgency and  dribbling.  He is currently radioactive for another month I assured him the symptoms will subside after the period of treatment is over.  I have asked to see him back in 3 months for follow-up with a PSA at that time.  He continues follow-up care with urology who will be addressing some of his urologic issues.  Patient is to call with any concerns.  I would like to take this opportunity to thank you for allowing me to participate in the care of your patient.Noreene Filbert, MD

## 2021-03-23 ENCOUNTER — Ambulatory Visit (INDEPENDENT_AMBULATORY_CARE_PROVIDER_SITE_OTHER): Payer: 59 | Admitting: Urology

## 2021-03-23 DIAGNOSIS — C61 Malignant neoplasm of prostate: Secondary | ICD-10-CM

## 2021-03-23 DIAGNOSIS — R39198 Other difficulties with micturition: Secondary | ICD-10-CM | POA: Diagnosis not present

## 2021-03-23 LAB — BLADDER SCAN AMB NON-IMAGING: Scan Result: 51

## 2021-03-23 MED ORDER — OXYBUTYNIN CHLORIDE ER 10 MG PO TB24: 10.0000 mg | ORAL_TABLET | Freq: Every day | ORAL | 2 refills | Status: DC

## 2021-03-23 MED ORDER — DOCUSATE SODIUM 100 MG PO CAPS: 100.0000 mg | ORAL_CAPSULE | Freq: Two times a day (BID) | ORAL | 0 refills | Status: DC

## 2021-03-24 DIAGNOSIS — C61 Malignant neoplasm of prostate: Secondary | ICD-10-CM | POA: Diagnosis not present

## 2021-03-28 ENCOUNTER — Other Ambulatory Visit: Payer: Self-pay | Admitting: Urology

## 2021-06-17 ENCOUNTER — Telehealth: Payer: Self-pay

## 2021-06-17 NOTE — Telephone Encounter (Signed)
Pt left message on triage line stating he had hematuria. Called pt. Pt advised to call clinic ?

## 2021-06-18 ENCOUNTER — Encounter: Payer: Self-pay | Admitting: Physician Assistant

## 2021-06-18 ENCOUNTER — Ambulatory Visit: Payer: 59 | Admitting: Physician Assistant

## 2021-06-18 VITALS — BP 149/92 | HR 71 | Ht 73.0 in | Wt 275.0 lb

## 2021-06-18 DIAGNOSIS — N3941 Urge incontinence: Secondary | ICD-10-CM

## 2021-06-18 DIAGNOSIS — R310 Gross hematuria: Secondary | ICD-10-CM | POA: Diagnosis not present

## 2021-06-18 DIAGNOSIS — R3911 Hesitancy of micturition: Secondary | ICD-10-CM | POA: Diagnosis not present

## 2021-06-18 LAB — URINALYSIS, COMPLETE
Bilirubin, UA: NEGATIVE
Leukocytes,UA: NEGATIVE
Nitrite, UA: NEGATIVE
Specific Gravity, UA: >1.03 — ABNORMAL HIGH (ref 1.005–1.030)
Urobilinogen, Ur: 0.2 mg/dL (ref 0.2–1.0)
pH, UA: 5.5 (ref 5.0–7.5)

## 2021-06-18 LAB — BLADDER SCAN AMB NON-IMAGING

## 2021-06-18 LAB — MICROSCOPIC EXAMINATION
Bacteria, UA: NONE SEEN
Epithelial Cells (non renal): NONE SEEN /hpf (ref 0–10)
RBC, Urine: >30 /hpf — AB (ref 0–2)

## 2021-06-18 MED ORDER — MIRABEGRON ER 25 MG PO TB24: 25.0000 mg | ORAL_TABLET | Freq: Every day | ORAL | 0 refills | Status: DC

## 2021-06-18 MED ORDER — TAMSULOSIN HCL 0.4 MG PO CAPS: 0.4000 mg | ORAL_CAPSULE | Freq: Every day | ORAL | 3 refills | Status: DC

## 2021-06-18 NOTE — Progress Notes (Signed)
? ?06/18/2021 ?3:56 PM  ? ?Raymond Michael ?05-06-1955 ?[id number] ? ?CC: ?Chief Complaint  ?Patient presents with  ? Follow-up  ? Hematuria  ? ?HPI: ?Raymond Michael is a 66 y.o. male with PMH prostate cancer on ADT s/p brachytherapy who presents today for evaluation of gross hematuria.  ? ?Today he reports an approximate 1 week history of intermittent gross hematuria without clots.  He reports some urinary hesitancy overnight, which is accompanied by dysuria.  He does not have dysuria during the day.  During the day, he has been having urinary urgency and urge incontinence and has been wearing depends for this. ? ?He was previously prescribed oxybutynin XL and tamsulosin but he is unsure if he is taking these.  He reports he has been having difficulty with dry mouth and dry eye. ? ?In-office UA today positive for 1+ glucose, trace ketones, 3+ blood, and 2+ protein; urine microscopy with >30 RBCs/HPF and granular casts. PVR 17m. ? ?PMH: ?Past Medical History:  ?Diagnosis Date  ? Arthritis   ? Cancer ([facility]   ? Diabetes ([place]   ? GERD (gastroesophageal reflux disease)   ? Hyperlipidemia   ? ? ?Surgical History: ?Past Surgical History:  ?Procedure Laterality Date  ? COLONOSCOPY    ? NOSE SURGERY    ? RADIOACTIVE SEED IMPLANT N/A 02/22/2021  ? Procedure: RADIOACTIVE SEED IMPLANT/BRACHYTHERAPY IMPLANT;  Surgeon: BHollice Espy MD;  Location: [facility] ORS;  Service: Urology;  Laterality: N/A;  ? TONSILLECTOMY AND ADENOIDECTOMY    ? ? ?Home Medications:  ?Allergies as of 06/18/2021   ?No Known Allergies ?  ? ?  ?Medication List  ?  ? ?  ? Accurate as of Jun 18, 2021  3:56 PM. If you have any questions, ask your nurse or doctor.  ?  ?  ? ?  ? ?STOP taking these medications   ? ?oxybutynin 10 MG 24 hr tablet ?Commonly known as: DITROPAN-XL ?Stopped by: SDebroah Loop PA-C ?  ? ?  ? ?TAKE these medications   ? ?CALCIUM + D3 PO ?Take 2 tablets by mouth in the morning. ?  ?clobetasol cream 0.05 % ?Commonly known as:  TEMOVATE ?Apply 1 application topically 2 (two) times daily as needed. ?  ?diclofenac 50 MG EC tablet ?Commonly known as: VOLTAREN ?Take 50 mg by mouth See admin instructions. Take 2 tablets (100 mg) by mouth in the morning & take 1 tablet (50 mg) by mouth at night. ?  ?diclofenac 50 MG tablet ?Commonly known as: CATAFLAM ?Take 50-100 mg by mouth See admin instructions. Take 2 tablets (100 mg) by mouth in the morning & take 1 tablet (50 mg) by mouth in the evening. ?  ?docusate sodium 100 MG capsule ?Commonly known as: COLACE ?TAKE 1 CAPSULE BY MOUTH TWICE A DAY ?  ?FISH OIL PO ?Take 2 capsules by mouth in the morning and at bedtime. ?  ?gabapentin 100 MG capsule ?Commonly known as: NEURONTIN ?Take 1 capsule by mouth twice daily ?  ?glipiZIDE 5 MG tablet ?Commonly known as: GLUCOTROL ?TAKE 1 TABLET BY MOUTH ONCE DAILY BEFORE BREAKFAST ?  ?HYDROcodone-acetaminophen 5-325 MG tablet ?Commonly known as: NORCO/VICODIN ?Take 1-2 tablets by mouth every 6 (six) hours as needed for moderate pain. ?  ?ibuprofen 200 MG tablet ?Commonly known as: ADVIL ?Take 400 mg by mouth every 8 (eight) hours as needed (for pain.). ?  ?metFORMIN 1000 MG tablet ?Commonly known as: GLUCOPHAGE ?TAKE 1 TABLET BY MOUTH TWICE DAILY WITH MEALS . APPOINTMENT REQUIRED  FOR FUTURE REFILLS ?  ?mirabegron ER 25 MG Tb24 tablet ?Commonly known as: MYRBETRIQ ?Take 1 tablet (25 mg total) by mouth daily. ?Started by: Debroah Loop, PA-C ?  ?naproxen 500 MG tablet ?Commonly known as: NAPROSYN ?TAKE 1 TABLET BY MOUTH TWICE A DAY AS NEEDED ?  ?pioglitazone 15 MG tablet ?Commonly known as: ACTOS ?TAKE 1 TABLET BY MOUTH EVERY DAY ?  ?rosuvastatin 10 MG tablet ?Commonly known as: CRESTOR ?TAKE 1 TABLET BY MOUTH EVERY DAY ?  ?Rybelsus 7 MG Tabs ?Generic drug: Semaglutide ?PLEASE SEE ATTACHED FOR DETAILED DIRECTIONS ?  ?sildenafil 100 MG tablet ?Commonly known as: VIAGRA ?Take 1 tablet (100 mg total) by mouth daily as needed for erectile dysfunction. Take one  tab one hour prior to intercourse ?  ?tamsulosin 0.4 MG Caps capsule ?Commonly known as: FLOMAX ?Take 1 capsule (0.4 mg total) by mouth daily. ?  ?VITAMIN D3 PO ?Take 1 tablet by mouth in the morning. ?  ?Xeljanz 5 MG Tabs ?Generic drug: Tofacitinib Citrate ?Take 5 mg by mouth 2 (two) times daily. ?  ? ?  ? ? ?Allergies:  ?No Known Allergies ? ?Family History: ?Family History  ?Problem Relation Age of Onset  ? Diabetes Father   ? Diabetes Brother   ? ? ?Social History:  ? reports that he has never smoked. He has never been exposed to tobacco smoke. He has never used smokeless tobacco. He reports current alcohol use. He reports that he does not use drugs. ? ?Physical Exam: ?BP (!) 149/92   Pulse 71   Ht '6\' 1"'$  (1.854 m)   Wt 275 lb (124.7 kg)   BMI 36.28 kg/m?   ?Constitutional:  Alert and oriented, no acute distress, nontoxic appearing ?HEENT: [place], AT ?Cardiovascular: No clubbing, cyanosis, or edema ?Respiratory: Normal respiratory effort, no increased work of breathing ?Skin: No rashes, bruises or suspicious lesions ?Neurologic: Grossly intact, no focal deficits, moving all 4 extremities ?Psychiatric: Normal mood and affect ? ?Laboratory Data: ?Results for orders placed or performed in visit on 06/18/21  ?Microscopic Examination  ? Urine  ?Result Value Ref Range  ? WBC, UA 0-5 0 - 5 /hpf  ? RBC >30 (A) 0 - 2 /hpf  ? Epithelial Cells (non renal) None seen 0 - 10 /hpf  ? Casts Present (A) None seen /lpf  ? Cast Type Granular casts (A) N/A  ? Bacteria, UA None seen None seen/Few  ?Urinalysis, Complete  ?Result Value Ref Range  ? Specific Gravity, UA >1.030 (H) 1.005 - 1.030  ? pH, UA 5.5 5.0 - 7.5  ? Color, UA Brown (A) Yellow  ? Appearance Ur Cloudy (A) Clear  ? Leukocytes,UA Negative Negative  ? Protein,UA 2+ (A) Negative/Trace  ? Glucose, UA 1+ (A) Negative  ? Ketones, UA Trace (A) Negative  ? RBC, UA 3+ (A) Negative  ? Bilirubin, UA Negative Negative  ? Urobilinogen, Ur 0.2 0.2 - 1.0 mg/dL  ? Nitrite, UA  Negative Negative  ? Microscopic Examination See below:   ?BLADDER SCAN AMB NON-IMAGING  ?Result Value Ref Range  ? Scan Result 68m   ? ?Assessment & Plan:   ?1. Gross hematuria ?UA notable only for hematuria, low concern for urinary infection today.  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  We discussed warning signs of gross hematuria including passage of thick, ketchup-like urine, passage of large blood clots, or the inability to void.  I counseled him to  stay well-hydrated and offered him cystoscopy for further evaluation, but he declined this. ?- Urinalysis, Complete ?- BLADDER SCAN AMB NON-IMAGING ? ?2. Urge incontinence ?Unclear if he is taking prescribed oxybutynin, however given reports of dry mouth and dry eye, I recommend switching to Myrbetriq 25 mg daily.  We will plan for symptom recheck and PVR in 4 weeks.  Samples provided today. ?- mirabegron ER (MYRBETRIQ) 25 MG TB24 tablet; Take 1 tablet (25 mg total) by mouth daily.  Dispense: 28 tablet; Refill: 0 ? ?3. Urinary hesitancy ?Counseled him to make sure he is taking Flomax and to take this at bedtime to help with his nighttime hesitancy. ?- tamsulosin (FLOMAX) 0.4 MG CAPS capsule; Take 1 capsule (0.4 mg total) by mouth daily.  Dispense: 90 capsule; Refill: 3  ? ?Return in about 4 weeks (around 07/16/2021) for Symptom recheck with PVR. ? ?Debroah Loop, PA-C ? ?[facility] ?[address] ?[place], [place] [address] ?[phone]   ?

## 2021-06-18 NOTE — Patient Instructions (Addendum)
CONTINUE Flomax (tamsulosin) once daily at night to help with your urine flow. ?STOP Ditropan (oxybutynin). ?START Myrbetriq (mirabegron) samples once daily to help with your daytime urgency and leakage. ? ?For the blood in your urine, stay well hydrated and let us know if you develop thick, ketchup-like urinary output; passage of large clots; dark, maroon-colored urine; lower abdominal pain; lumbar pain; abdominal distention; or the inability to urinate. ?

## 2021-06-21 ENCOUNTER — Inpatient Hospital Stay: Payer: 59 | Attending: Internal Medicine

## 2021-06-21 ENCOUNTER — Ambulatory Visit: Payer: 59 | Admitting: Urology

## 2021-06-21 DIAGNOSIS — C61 Malignant neoplasm of prostate: Secondary | ICD-10-CM

## 2021-06-28 ENCOUNTER — Ambulatory Visit: Payer: 59 | Admitting: Urology

## 2021-06-28 ENCOUNTER — Ambulatory Visit
Admission: RE | Admit: 2021-06-28 | Discharge: 2021-06-28 | Disposition: A | Discharge status: Home / Self Care | Payer: 59 | Source: Ambulatory Visit | Attending: Radiation Oncology | Admitting: Radiation Oncology

## 2021-06-28 VITALS — BP 128/71 | HR 68 | Temp 96.7°F | Ht 73.0 in | Wt 283.6 lb

## 2021-06-28 DIAGNOSIS — C61 Malignant neoplasm of prostate: Secondary | ICD-10-CM | POA: Insufficient documentation

## 2021-06-29 ENCOUNTER — Other Ambulatory Visit: Payer: 59

## 2021-07-12 NOTE — Progress Notes (Deleted)
07/13/2021 11:47 AM   Raymond Michael 1955/04/27 [id number]  Referring provider: Associates, [facility] [address] [place],  [place] [address]  Urological history 1. Prostate cancer -PSA pending  -iPSA 93.51  -prostate biopsy 11/2020 - pathology showed multiple cores bilaterally of Gleason 4+3, 3+4 and 3+3.  Large volume up to 86% -CT abdomen pelvis as well as bone scan 11/2020 both of which are negative for any evidence of metastatic disease -PET scan and PSMA 11/2020 - negative -started on ADT 12/22/2020 -prostate seed implantation 02/2021  2. ED -contributing factors of age, BPH, prostate cancer, diabetes, alcohol, HLD and obesity -managed with sildenafil 100 mg, on-demand-dosing  3. LU TS -Contributing factors of age, prostate cancer, pelvic radiation, constipation, sleep apnea, diabetes, arthritis, alcohol, -managed with tamsulosin 0.4 mg daily    HPI: Raymond Michael is a 66 y.o. male who presents today for follow-up on episodes of gross hematuria, lower urinary tract symptoms, prostate cancer and erectile dysfunction.  He was most recently seen by Sam on Jun 18, 2021 after having episodes of intermittent gross hematuria.  Previous to that he had been prescribed oxybutynin XL and tamsulosin for his lower urinary symptoms, but he was unsure at the time of Sam's appointment if he was taking those medications.  On urinalysis noted microscopic hematuria, PVR was 52 mL and he was given Myrbetriq 25 mg samples.  I PSS *** UA *** PVR ***    Score:  1-7 Mild 8-19 Moderate 20-35 Severe    PMH: Past Medical History:  Diagnosis Date   Arthritis    Cancer ([place])    Diabetes ([place])    GERD (gastroesophageal reflux disease)    Hyperlipidemia     Surgical History: Past Surgical History:  Procedure Laterality Date   COLONOSCOPY     NOSE SURGERY     RADIOACTIVE SEED IMPLANT N/A 02/22/2021   Procedure: RADIOACTIVE SEED IMPLANT/BRACHYTHERAPY IMPLANT;  Surgeon:  Hollice Espy, MD;  Location: [facility] ORS;  Service: Urology;  Laterality: N/A;   TONSILLECTOMY AND ADENOIDECTOMY      Home Medications:  Allergies as of 07/13/2021   No Known Allergies      Medication List        Accurate as of Jul 12, 2021 11:47 AM. If you have any questions, ask your nurse or doctor.          CALCIUM + D3 PO Take 2 tablets by mouth in the morning.   clobetasol cream 0.05 % Commonly known as: TEMOVATE Apply 1 application topically 2 (two) times daily as needed.   diclofenac 50 MG EC tablet Commonly known as: VOLTAREN Take 50 mg by mouth See admin instructions. Take 2 tablets (100 mg) by mouth in the morning & take 1 tablet (50 mg) by mouth at night.   diclofenac 50 MG tablet Commonly known as: CATAFLAM Take 50-100 mg by mouth See admin instructions. Take 2 tablets (100 mg) by mouth in the morning & take 1 tablet (50 mg) by mouth in the evening.   docusate sodium 100 MG capsule Commonly known as: COLACE TAKE 1 CAPSULE BY MOUTH TWICE A DAY   FISH OIL PO Take 2 capsules by mouth in the morning and at bedtime.   gabapentin 100 MG capsule Commonly known as: NEURONTIN Take 1 capsule by mouth twice daily   glipiZIDE 5 MG tablet Commonly known as: GLUCOTROL TAKE 1 TABLET BY MOUTH ONCE DAILY BEFORE BREAKFAST   HYDROcodone-acetaminophen 5-325 MG tablet Commonly known as: NORCO/VICODIN  Take 1-2 tablets by mouth every 6 (six) hours as needed for moderate pain.   ibuprofen 200 MG tablet Commonly known as: ADVIL Take 400 mg by mouth every 8 (eight) hours as needed (for pain.).   metFORMIN 1000 MG tablet Commonly known as: GLUCOPHAGE TAKE 1 TABLET BY MOUTH TWICE DAILY WITH MEALS . APPOINTMENT REQUIRED FOR FUTURE REFILLS   mirabegron ER 25 MG Tb24 tablet Commonly known as: MYRBETRIQ Take 1 tablet (25 mg total) by mouth daily.   naproxen 500 MG tablet Commonly known as: NAPROSYN TAKE 1 TABLET BY MOUTH TWICE A DAY AS NEEDED   pioglitazone 15 MG  tablet Commonly known as: ACTOS TAKE 1 TABLET BY MOUTH EVERY DAY   rosuvastatin 10 MG tablet Commonly known as: CRESTOR TAKE 1 TABLET BY MOUTH EVERY DAY   Rybelsus 7 MG Tabs Generic drug: Semaglutide PLEASE SEE ATTACHED FOR DETAILED DIRECTIONS   sildenafil 100 MG tablet Commonly known as: VIAGRA Take 1 tablet (100 mg total) by mouth daily as needed for erectile dysfunction. Take one tab one hour prior to intercourse   tamsulosin 0.4 MG Caps capsule Commonly known as: FLOMAX Take 1 capsule (0.4 mg total) by mouth daily.   VITAMIN D3 PO Take 1 tablet by mouth in the morning.   Xeljanz 5 MG Tabs Generic drug: Tofacitinib Citrate Take 5 mg by mouth 2 (two) times daily.        Allergies: No Known Allergies  Family History: Family History  Problem Relation Age of Onset   Diabetes Father    Diabetes Brother     Social History:  reports that he has never smoked. He has never been exposed to tobacco smoke. He has never used smokeless tobacco. He reports current alcohol use. He reports that he does not use drugs.  ROS: Pertinent ROS in HPI  Physical Exam: There were no vitals taken for this visit.  Constitutional:  Well nourished. Alert and oriented, No acute distress. HEENT: [place] AT, moist mucus membranes.  Trachea midline Cardiovascular: No clubbing, cyanosis, or edema. Respiratory: Normal respiratory effort, no increased work of breathing. GU: No CVA tenderness.  No bladder fullness or masses.  Patient with circumcised/uncircumcised phallus. ***Foreskin easily retracted***  Urethral meatus is patent.  No penile discharge. No penile lesions or rashes. Scrotum without lesions, cysts, rashes and/or edema.  Testicles are located scrotally bilaterally. No masses are appreciated in the testicles. Left and right epididymis are normal. Rectal: Patient with  normal sphincter tone. Anus and perineum without scarring or rashes. No rectal masses are appreciated. Prostate is  approximately *** grams, *** nodules are appreciated. Seminal vesicles are normal. Neurologic: Grossly intact, no focal deficits, moving all 4 extremities. Psychiatric: Normal mood and affect.   Laboratory Data: Urinalysis *** I have reviewed the labs.  Pertinent Imaging: ***  Assessment & Plan:    1.  Prostate cancer -PSA pending  -Eligard given today  2. LUTS -PSA pending -UA benign *** -PVR < 300 cc *** -symptoms - *** -most bothersome symptoms are *** -continue conservative management, avoiding bladder irritants and timed voiding's -Initiate alpha-blocker (***), discussed side effects *** -Initiate 5 alpha reductase inhibitor (***), discussed side effects *** -Continue tamsulosin 0.4 mg daily, alfuzosin 10 mg daily, Rapaflo 8 mg daily, terazosin, doxazosin, Cialis 5 mg daily and finasteride 5 mg daily, dutasteride 0.5 mg daily***:refills given -Cannot tolerate medication or medication failure, schedule cystoscopy ***  3. ED ***  4. Gross hematuria -UA *** -no reports of gross hematuria ***  No follow-ups on file.  These notes generated with voice recognition software. I apologize for typographical errors.  [name], PA-C  [facility] [address]  [place], [place] [address] [phone]

## 2021-07-13 ENCOUNTER — Ambulatory Visit: Payer: 59 | Admitting: Urology

## 2021-07-13 ENCOUNTER — Encounter: Payer: Self-pay | Admitting: Urology

## 2021-07-13 ENCOUNTER — Ambulatory Visit (INDEPENDENT_AMBULATORY_CARE_PROVIDER_SITE_OTHER): Payer: 59 | Admitting: Urology

## 2021-07-13 VITALS — BP 138/79 | HR 77 | Ht 73.0 in | Wt 280.0 lb

## 2021-07-13 DIAGNOSIS — C61 Malignant neoplasm of prostate: Secondary | ICD-10-CM

## 2021-07-13 DIAGNOSIS — R3915 Urgency of urination: Secondary | ICD-10-CM

## 2021-07-13 DIAGNOSIS — R350 Frequency of micturition: Secondary | ICD-10-CM | POA: Diagnosis not present

## 2021-07-13 DIAGNOSIS — N529 Male erectile dysfunction, unspecified: Secondary | ICD-10-CM | POA: Diagnosis not present

## 2021-07-13 DIAGNOSIS — R399 Unspecified symptoms and signs involving the genitourinary system: Secondary | ICD-10-CM

## 2021-07-13 DIAGNOSIS — Z8546 Personal history of malignant neoplasm of prostate: Secondary | ICD-10-CM | POA: Diagnosis not present

## 2021-07-13 DIAGNOSIS — R310 Gross hematuria: Secondary | ICD-10-CM | POA: Diagnosis not present

## 2021-07-13 DIAGNOSIS — R300 Dysuria: Secondary | ICD-10-CM

## 2021-07-13 DIAGNOSIS — N5201 Erectile dysfunction due to arterial insufficiency: Secondary | ICD-10-CM

## 2021-07-13 LAB — BLADDER SCAN AMB NON-IMAGING

## 2021-07-13 NOTE — Progress Notes (Signed)
07/13/2021 2:43 PM   Raymond Michael June 06, 1955 [id number]  Referring provider: Associates, [facility] [address] [place],  [place] [address]  Urological history 1. Prostate cancer -PSA undetectable per patient -iPSA 93.51  -prostate biopsy 11/2020 - pathology showed multiple cores bilaterally of Gleason 4+3, 3+4 and 3+3.  Large volume up to 86% -CT abdomen pelvis as well as bone scan 11/2020 both of which are negative for any evidence of metastatic disease -PET scan and PSMA 11/2020 - negative -started on ADT 12/22/2020 -prostate seed implantation 02/2021  2. ED -contributing factors of age, BPH, prostate cancer, diabetes, alcohol, HLD and obesity -SHIM 5 -managed with sildenafil 100 mg, on-demand-dosing  3. LU TS -Contributing factors of age, prostate cancer, pelvic radiation, constipation, sleep apnea, diabetes, arthritis and alcohol -I PSS 23/4 -managed with tamsulosin 0.4 mg twice daily     HPI: Raymond Michael is a 66 y.o. male who presents today for follow-up on episodes of gross hematuria, lower urinary tract symptoms, prostate cancer and erectile dysfunction.  He was most recently seen by Sam on Jun 18, 2021 after having episodes of intermittent gross hematuria.  Previous to that he had been prescribed oxybutynin XL and tamsulosin for his lower urinary symptoms, but he was unsure at the time of Sam's appointment if he was taking those medications.  On urinalysis noted microscopic hematuria, PVR was 52 mL and he was given Myrbetriq 25 mg samples.  He has circled frequent urination, urgent urination, burning/painful, weak/split, blood in urine, erection problems, night sweats and constipation.    He is having to make frequent trips to the restroom with great urgency and burning with urination.  He also states his stream is very weak.  He stated the pink-tinged urine stopped 2 weeks ago and is now a darker yellow.  He also has nocturia x3.  He did not find the  Myrbetriq samples effective in easing his urinary symptoms.  He also states he does not drink as much water as he should.  He is also having issues with constipation and now is belching with some gas.  His PCP has been in contact with him to schedule a colonoscopy.  He has had no libido since his prostate cancer treatment.  He has not tried the sildenafil as he is fearful of the ejaculation may burn.  He also declined PSA drawn today saying his primary care drew his PCP and it was -1.    I PSS 23/4 UA nitrate positive, 6-10 WBCs, 11-30 RBCs and a few bacteria PVR 57 mL   IPSS     Row Name 07/13/21 1300         International Prostate Symptom Score   How often have you had the sensation of not emptying your bladder? Less than half the time     How often have you had to urinate less than every two hours? More than half the time     How often have you found you stopped and started again several times when you urinated? More than half the time     How often have you found it difficult to postpone urination? Almost always     How often have you had a weak urinary stream? About half the time     How often have you had to strain to start urination? Less than half the time     How many times did you typically get up at night to urinate? 3 Times  Total IPSS Score 23       Quality of Life due to urinary symptoms   If you were to spend the rest of your life with your urinary condition just the way it is now how would you feel about that? Mostly Disatisfied              Score:  1-7 Mild 8-19 Moderate 20-35 Severe   SHIM     Row Name 07/13/21 1339         SHIM: Over the last 6 months:   How do you rate your confidence that you could get and keep an erection? Very Low     When you had erections with sexual stimulation, how often were your erections hard enough for penetration (entering your partner)? Almost Never or Never     During sexual intercourse, how often were you able to  maintain your erection after you had penetrated (entered) your partner? Almost Never or Never     During sexual intercourse, how difficult was it to maintain your erection to completion of intercourse? Extremely Difficult     When you attempted sexual intercourse, how often was it satisfactory for you? Almost Never or Never       SHIM Total Score   SHIM 5              Score: 1-7 Severe ED 8-11 Moderate ED 12-16 Mild-Moderate ED 17-21 Mild ED 22-25 No ED   PMH: Past Medical History:  Diagnosis Date   Arthritis    Cancer ([place])    Diabetes ([place])    GERD (gastroesophageal reflux disease)    Hyperlipidemia     Surgical History: Past Surgical History:  Procedure Laterality Date   COLONOSCOPY     NOSE SURGERY     RADIOACTIVE SEED IMPLANT N/A 02/22/2021   Procedure: RADIOACTIVE SEED IMPLANT/BRACHYTHERAPY IMPLANT;  Surgeon: Hollice Espy, MD;  Location: [facility] ORS;  Service: Urology;  Laterality: N/A;   TONSILLECTOMY AND ADENOIDECTOMY      Home Medications:  Allergies as of 07/13/2021   No Known Allergies      Medication List        Accurate as of Jul 13, 2021  2:43 PM. If you have any questions, ask your nurse or doctor.          CALCIUM + D3 PO Take 2 tablets by mouth in the morning.   clobetasol cream 0.05 % Commonly known as: TEMOVATE Apply 1 application topically 2 (two) times daily as needed.   diclofenac 50 MG EC tablet Commonly known as: VOLTAREN Take 50 mg by mouth See admin instructions. Take 2 tablets (100 mg) by mouth in the morning & take 1 tablet (50 mg) by mouth at night.   diclofenac 50 MG tablet Commonly known as: CATAFLAM Take 50-100 mg by mouth See admin instructions. Take 2 tablets (100 mg) by mouth in the morning & take 1 tablet (50 mg) by mouth in the evening.   docusate sodium 100 MG capsule Commonly known as: COLACE TAKE 1 CAPSULE BY MOUTH TWICE A DAY   FISH OIL PO Take 2 capsules by mouth in the morning and at bedtime.    gabapentin 100 MG capsule Commonly known as: NEURONTIN Take 1 capsule by mouth twice daily   glipiZIDE 5 MG tablet Commonly known as: GLUCOTROL TAKE 1 TABLET BY MOUTH ONCE DAILY BEFORE BREAKFAST   HYDROcodone-acetaminophen 5-325 MG tablet Commonly known as: NORCO/VICODIN Take 1-2 tablets by mouth every 6 (six) hours  as needed for moderate pain.   ibuprofen 200 MG tablet Commonly known as: ADVIL Take 400 mg by mouth every 8 (eight) hours as needed (for pain.).   metFORMIN 1000 MG tablet Commonly known as: GLUCOPHAGE TAKE 1 TABLET BY MOUTH TWICE DAILY WITH MEALS . APPOINTMENT REQUIRED FOR FUTURE REFILLS   mirabegron ER 25 MG Tb24 tablet Commonly known as: MYRBETRIQ Take 1 tablet (25 mg total) by mouth daily.   naproxen 500 MG tablet Commonly known as: NAPROSYN TAKE 1 TABLET BY MOUTH TWICE A DAY AS NEEDED   pioglitazone 15 MG tablet Commonly known as: ACTOS TAKE 1 TABLET BY MOUTH EVERY DAY   rosuvastatin 10 MG tablet Commonly known as: CRESTOR TAKE 1 TABLET BY MOUTH EVERY DAY   Rybelsus 7 MG Tabs Generic drug: Semaglutide PLEASE SEE ATTACHED FOR DETAILED DIRECTIONS   sildenafil 100 MG tablet Commonly known as: VIAGRA Take 1 tablet (100 mg total) by mouth daily as needed for erectile dysfunction. Take one tab one hour prior to intercourse   tamsulosin 0.4 MG Caps capsule Commonly known as: FLOMAX Take 1 capsule (0.4 mg total) by mouth daily.   VITAMIN D3 PO Take 1 tablet by mouth in the morning.   Xeljanz 5 MG Tabs Generic drug: Tofacitinib Citrate Take 5 mg by mouth 2 (two) times daily.        Allergies: No Known Allergies  Family History: Family History  Problem Relation Age of Onset   Diabetes Father    Diabetes Brother     Social History:  reports that he has never smoked. He has never been exposed to tobacco smoke. He has never used smokeless tobacco. He reports current alcohol use. He reports that he does not use drugs.  ROS: Pertinent ROS in  HPI  Physical Exam: Blood pressure 138/79, pulse 77, height '6\' 1"'$  (1.854 m), weight 280 lb (127 kg).  Constitutional:  Well nourished. Alert and oriented, No acute distress. HEENT: [place] AT, moist mucus membranes.  Trachea midline Cardiovascular: No clubbing, cyanosis, or edema. Respiratory: Normal respiratory effort, no increased work of breathing. Neurologic: Grossly intact, no focal deficits, moving all 4 extremities. Psychiatric: Normal mood and affect.   Laboratory Data: Urinalysis See Epic I have reviewed the labs.  Pertinent Imaging:  07/13/21 13:35  Scan Result 65m    Assessment & Plan:    1.  Prostate cancer -PSA undetectable per patient's report -He will return in 2 weeks for Eligard as he is too early at today's appointment  2. LUTS -UA grossly infected-we will hold on prescribing antibiotics until culture results are available -PVR < 300 cc  -most bothersome symptoms are frequency, urgency and dysuria -continue conservative management, avoiding bladder irritants and timed voiding's -decrease tamsulosin 0.4 mg daily to once daily -Given Gemtesa 75 mg daily samples  3. ED -concerned about burning with ejaculation/orgasm -Explained that with the prostate cancer treatment and with the tamsulosin on board, he will likely not produce ejaculate fluid so hopefully he will not have a burning sensation but we would not know until he attempted sexual activity  4. Gross hematuria -UA 11-30 RBC's -Urine culture is pending   Return in about 2 weeks (around 07/27/2021) for Eligard.  These notes generated with voice recognition software. I apologize for typographical errors.  SZara Council PA-C  [facility] [address] [place] [phone]

## 2021-07-13 NOTE — Progress Notes (Deleted)
Eligard SubQ Injection   Due to Prostate Cancer patient is present today for a Eligard Injection.  Medication: Eligard *** month Dose: *** mg  Location: {RIGHT/LEFT:20294}  Lot: *** Exp: ***  Patient tolerated well, {dnt complications:20057}  Performed by: ***  Per Dr. [name] patient is to continue therapy for {Time; 3 months to 1 year:10399} . Patient's next follow up was scheduled for ***. This appointment was scheduled using wheel and given to patient today along with reminder continue on Vitamin D 800-1000iu and Calcium 1000-'1200mg'$  daily while on Androgen Deprivation Therapy.  PA approval dates:

## 2021-07-14 ENCOUNTER — Other Ambulatory Visit: Payer: Self-pay

## 2021-07-14 ENCOUNTER — Telehealth: Payer: Self-pay

## 2021-07-14 DIAGNOSIS — Z1211 Encounter for screening for malignant neoplasm of colon: Secondary | ICD-10-CM

## 2021-07-14 LAB — MICROSCOPIC EXAMINATION

## 2021-07-14 LAB — URINALYSIS, COMPLETE
Bilirubin, UA: NEGATIVE
Nitrite, UA: POSITIVE — AB
Specific Gravity, UA: 1.03 (ref 1.005–1.030)
Urobilinogen, Ur: 0.2 mg/dL (ref 0.2–1.0)
pH, UA: 5.5 (ref 5.0–7.5)

## 2021-07-14 MED ORDER — NA SULFATE-K SULFATE-MG SULF 17.5-3.13-1.6 GM/177ML PO SOLN: 1.0000 | Freq: Once | ORAL | 0 refills | Status: AC

## 2021-07-14 NOTE — Telephone Encounter (Signed)
Gastroenterology Pre-Procedure Review  Request Date: 08/13/21 Requesting Physician: Dr. Vicente Males  PATIENT REVIEW QUESTIONS: The patient responded to the following health history questions as indicated:    1. Are you having any GI issues? no GI issues noted on this triage 2. Do you have a personal history of Polyps? no 3. Do you have a family history of Colon Cancer or Polyps? no 4. Diabetes Mellitus? yes (type 2) 5. Joint replacements in the past 12 months?no 6. Major health problems in the past 3 months?yes (02/22/21 Prostate CA) 7. Any artificial heart valves, MVP, or defibrillator?no    MEDICATIONS & ALLERGIES:    Patient reports the following regarding taking any anticoagulation/antiplatelet therapy:   Plavix, Coumadin, Eliquis, Xarelto, Lovenox, Pradaxa, Brilinta, or Effient? no Aspirin? no  Patient confirms/reports the following medications:  Current Outpatient Medications  Medication Sig Dispense Refill   Calcium Carb-Cholecalciferol (CALCIUM + D3 PO) Take 2 tablets by mouth in the morning.     Cholecalciferol (VITAMIN D3 PO) Take 1 tablet by mouth in the morning.     clobetasol cream (TEMOVATE) 1.74 % Apply 1 application topically 2 (two) times daily as needed.     diclofenac (CATAFLAM) 50 MG tablet Take 50-100 mg by mouth See admin instructions. Take 2 tablets (100 mg) by mouth in the morning & take 1 tablet (50 mg) by mouth in the evening.     diclofenac (VOLTAREN) 50 MG EC tablet Take 50 mg by mouth See admin instructions. Take 2 tablets (100 mg) by mouth in the morning & take 1 tablet (50 mg) by mouth at night.     docusate sodium (COLACE) 100 MG capsule TAKE 1 CAPSULE BY MOUTH TWICE A DAY 90 capsule 0   gabapentin (NEURONTIN) 100 MG capsule Take 1 capsule by mouth twice daily 180 capsule 2   glipiZIDE (GLUCOTROL) 5 MG tablet TAKE 1 TABLET BY MOUTH ONCE DAILY BEFORE BREAKFAST 30 tablet 0   HYDROcodone-acetaminophen (NORCO/VICODIN) 5-325 MG tablet Take 1-2 tablets by mouth every  6 (six) hours as needed for moderate pain. 10 tablet 0   ibuprofen (ADVIL) 200 MG tablet Take 400 mg by mouth every 8 (eight) hours as needed (for pain.).     metFORMIN (GLUCOPHAGE) 1000 MG tablet TAKE 1 TABLET BY MOUTH TWICE DAILY WITH MEALS . APPOINTMENT REQUIRED FOR FUTURE REFILLS 120 tablet 0   mirabegron ER (MYRBETRIQ) 25 MG TB24 tablet Take 1 tablet (25 mg total) by mouth daily. 28 tablet 0   naproxen (NAPROSYN) 500 MG tablet TAKE 1 TABLET BY MOUTH TWICE A DAY AS NEEDED 180 tablet 1   Omega-3 Fatty Acids (FISH OIL PO) Take 2 capsules by mouth in the morning and at bedtime.     pioglitazone (ACTOS) 15 MG tablet TAKE 1 TABLET BY MOUTH EVERY DAY 90 tablet 0   rosuvastatin (CRESTOR) 10 MG tablet TAKE 1 TABLET BY MOUTH EVERY DAY 90 tablet 3   RYBELSUS 7 MG TABS PLEASE SEE ATTACHED FOR DETAILED DIRECTIONS     sildenafil (VIAGRA) 100 MG tablet Take 1 tablet (100 mg total) by mouth daily as needed for erectile dysfunction. Take one tab one hour prior to intercourse 30 tablet 3   tamsulosin (FLOMAX) 0.4 MG CAPS capsule Take 1 capsule (0.4 mg total) by mouth daily. 90 capsule 3   XELJANZ 5 MG TABS Take 5 mg by mouth 2 (two) times daily.     No current facility-administered medications for this visit.    Patient confirms/reports the following allergies:  No Known Allergies  No orders of the defined types were placed in this encounter.   AUTHORIZATION INFORMATION Primary Insurance: 1D#: Group #:  Secondary Insurance: 1D#: Group #:  SCHEDULE INFORMATION: Date:  Time: Location:

## 2021-07-15 ENCOUNTER — Encounter: Payer: Self-pay | Admitting: Family

## 2021-07-19 ENCOUNTER — Telehealth: Payer: Self-pay

## 2021-07-19 LAB — CULTURE, URINE COMPREHENSIVE

## 2021-07-19 NOTE — Telephone Encounter (Signed)
-----   Message from Nori Riis, PA-C sent at 07/19/2021  8:03 AM EDT ----- Please let Mr. Raymond Michael know that his urine culture just grew out a tiny amount of bacteria, so he does not have infection.  Are the Gemtesa samples helping?

## 2021-07-19 NOTE — Telephone Encounter (Signed)
Ok per DPR, [facility] notifying pt. Asked pt to return call.

## 2021-07-26 ENCOUNTER — Ambulatory Visit (INDEPENDENT_AMBULATORY_CARE_PROVIDER_SITE_OTHER): Payer: 59 | Admitting: Physician Assistant

## 2021-07-26 DIAGNOSIS — C61 Malignant neoplasm of prostate: Secondary | ICD-10-CM | POA: Diagnosis not present

## 2021-07-26 DIAGNOSIS — R399 Unspecified symptoms and signs involving the genitourinary system: Secondary | ICD-10-CM

## 2021-07-26 DIAGNOSIS — R351 Nocturia: Secondary | ICD-10-CM | POA: Diagnosis not present

## 2021-07-26 MED ORDER — LEUPROLIDE ACETATE (6 MONTH) 45 MG ~~LOC~~ KIT
45.0000 mg | PACK | Freq: Once | SUBCUTANEOUS | Status: AC
  Administered 2021-07-26: 45 mg via SUBCUTANEOUS

## 2021-07-26 NOTE — Progress Notes (Signed)
07/26/2021 5:14 PM   Raymond Michael Nov 24, 1955 [id number]  CC: Chief Complaint  Patient presents with   Prostate Cancer    HPI: Raymond Michael is a 66 y.o. 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   Today he reports his nocturia was significantly improved last night for the first time since starting Gemtesa 2 weeks ago.  He is unsure if this was a fluke.  He wonders what would happen if he discontinued ADT early.  PMH: Past Medical History:  Diagnosis Date   Arthritis    Cancer ([place])    Diabetes ([place])    GERD (gastroesophageal reflux disease)    Hyperlipidemia     Surgical History: Past Surgical History:  Procedure Laterality Date   COLONOSCOPY     NOSE SURGERY     RADIOACTIVE SEED IMPLANT N/A 02/22/2021   Procedure: RADIOACTIVE SEED IMPLANT/BRACHYTHERAPY IMPLANT;  Surgeon: Hollice Espy, MD;  Location: [facility] ORS;  Service: Urology;  Laterality: N/A;   TONSILLECTOMY AND ADENOIDECTOMY      Home Medications:  Allergies as of 07/26/2021   No Known Allergies      Medication List        Accurate as of July 26, 2021  5:14 PM. If you have any questions, ask your nurse or doctor.          CALCIUM + D3 PO Take 2 tablets by mouth in the morning.   clobetasol cream 0.05 % Commonly known as: TEMOVATE Apply 1 application topically 2 (two) times daily as needed.   diclofenac 50 MG EC tablet Commonly known as: VOLTAREN Take 50 mg by mouth See admin instructions. Take 2 tablets (100 mg) by mouth in the morning & take 1 tablet (50 mg) by mouth at night.   diclofenac 50 MG tablet Commonly known as: CATAFLAM Take 50-100 mg by mouth See admin instructions. Take 2 tablets (100 mg) by mouth in the morning & take 1 tablet (50 mg) by mouth in the evening.   docusate sodium 100 MG capsule Commonly known as: COLACE TAKE 1 CAPSULE BY MOUTH TWICE A  DAY   FISH OIL PO Take 2 capsules by mouth in the morning and at bedtime.   gabapentin 100 MG capsule Commonly known as: NEURONTIN Take 1 capsule by mouth twice daily   glipiZIDE 5 MG tablet Commonly known as: GLUCOTROL TAKE 1 TABLET BY MOUTH ONCE DAILY BEFORE BREAKFAST   HYDROcodone-acetaminophen 5-325 MG tablet Commonly known as: NORCO/VICODIN Take 1-2 tablets by mouth every 6 (six) hours as needed for moderate pain.   ibuprofen 200 MG tablet Commonly known as: ADVIL Take 400 mg by mouth every 8 (eight) hours as needed (for pain.).   metFORMIN 1000 MG tablet Commonly known as: GLUCOPHAGE TAKE 1 TABLET BY MOUTH TWICE DAILY WITH MEALS . APPOINTMENT REQUIRED FOR FUTURE REFILLS   mirabegron ER 25 MG Tb24 tablet Commonly known as: MYRBETRIQ Take 1 tablet (25 mg total) by mouth daily.   naproxen 500 MG tablet Commonly known as: NAPROSYN TAKE 1 TABLET BY MOUTH TWICE A DAY AS NEEDED   pioglitazone 15 MG tablet Commonly known as: ACTOS TAKE 1 TABLET BY MOUTH EVERY DAY   rosuvastatin 10 MG tablet Commonly known as: CRESTOR TAKE 1 TABLET BY MOUTH EVERY DAY   Rybelsus 7 MG Tabs Generic drug: Semaglutide PLEASE SEE ATTACHED FOR DETAILED DIRECTIONS  sildenafil 100 MG tablet Commonly known as: VIAGRA Take 1 tablet (100 mg total) by mouth daily as needed for erectile dysfunction. Take one tab one hour prior to intercourse   tamsulosin 0.4 MG Caps capsule Commonly known as: FLOMAX Take 1 capsule (0.4 mg total) by mouth daily.   VITAMIN D3 PO Take 1 tablet by mouth in the morning.   Xeljanz 5 MG Tabs Generic drug: Tofacitinib Citrate Take 5 mg by mouth 2 (two) times daily.        Allergies:  No Known Allergies  Family History: Family History  Problem Relation Age of Onset   Diabetes Father    Diabetes Brother     Social History:   reports that he has never smoked. He has never been exposed to tobacco smoke. He has never used smokeless tobacco. He reports  current alcohol use. He reports that he does not use drugs.  Physical Exam: There were no vitals taken for this visit.  Constitutional:  Alert and oriented, no acute distress, nontoxic appearing HEENT: [place], AT Cardiovascular: No clubbing, cyanosis, or edema Respiratory: Normal respiratory effort, no increased work of breathing Skin: No rashes, bruises or suspicious lesions Neurologic: Grossly intact, no focal deficits, moving all 4 extremities Psychiatric: Normal mood and affect  Assessment & Plan:   1. Prostate cancer [facility]) We discussed that early cessation of ADT would increase his risk for cancer recurrence versus progression.  He expressed understanding.  We will schedule him for 40-monthEligard. - leuprolide (6 Month) (ELIGARD) injection 45 mg  2. Lower urinary tract symptoms (LUTS) We discussed the Gemtesa can take up to 2 weeks to create symptomatic improvement.  We will keep him on this and plan for symptom recheck in 2 more weeks.  Return in about 2 weeks (around 08/09/2021) for Symptom recheck with PVR.  SDebroah Loop PA-C  [facility] [address] [place] [phone]

## 2021-07-26 NOTE — Progress Notes (Signed)
Eligard SubQ Injection   Due to Prostate Cancer patient is present today for a Eligard Injection.  Medication: Eligard 6 month Dose: 45 mg  Location: right  Lot: [id number] Exp: 12/2022  Patient tolerated well, no complications were noted  Performed by: Elberta Leatherwood, [employer]  Per Dr. Donella Stade patient is to continue therapy for 2 years . Patient's next follow up was scheduled for January 25, 2022. This appointment was scheduled using wheel and given to patient today along with reminder continue on Vitamin D 800-1000iu and Calcium 1000-'1200mg'$  daily while on Androgen Deprivation Therapy.  PA approval dates:  Expires in October, [place] aware of need for PA.

## 2021-08-09 ENCOUNTER — Encounter: Payer: Self-pay | Admitting: Physician Assistant

## 2021-08-09 ENCOUNTER — Ambulatory Visit (INDEPENDENT_AMBULATORY_CARE_PROVIDER_SITE_OTHER): Payer: 59 | Admitting: Physician Assistant

## 2021-08-09 ENCOUNTER — Ambulatory Visit: Payer: 59 | Admitting: Physician Assistant

## 2021-08-09 VITALS — BP 115/69 | HR 73 | Ht 73.0 in | Wt 280.0 lb

## 2021-08-09 DIAGNOSIS — R351 Nocturia: Secondary | ICD-10-CM

## 2021-08-09 DIAGNOSIS — R399 Unspecified symptoms and signs involving the genitourinary system: Secondary | ICD-10-CM

## 2021-08-09 DIAGNOSIS — R350 Frequency of micturition: Secondary | ICD-10-CM | POA: Diagnosis not present

## 2021-08-09 LAB — BLADDER SCAN AMB NON-IMAGING

## 2021-08-09 MED ORDER — GEMTESA 75 MG PO TABS: 75.0000 mg | ORAL_TABLET | Freq: Every day | ORAL | 3 refills | Status: DC

## 2021-08-13 ENCOUNTER — Ambulatory Visit
Admission: RE | Admit: 2021-08-13 | Discharge: 2021-08-13 | Disposition: A | Discharge status: Home / Self Care | Payer: 59 | Attending: Gastroenterology | Admitting: Gastroenterology

## 2021-08-13 ENCOUNTER — Ambulatory Visit: Payer: 59 | Admitting: Certified Registered"

## 2021-08-13 ENCOUNTER — Encounter
Admission: RE | Disposition: A | Discharge status: Home / Self Care | Payer: Self-pay | Source: Home / Self Care | Attending: Gastroenterology

## 2021-08-13 ENCOUNTER — Encounter: Payer: Self-pay | Admitting: Gastroenterology

## 2021-08-13 DIAGNOSIS — G4730 Sleep apnea, unspecified: Secondary | ICD-10-CM | POA: Insufficient documentation

## 2021-08-13 DIAGNOSIS — Z6836 Body mass index (BMI) 36.0-36.9, adult: Secondary | ICD-10-CM | POA: Insufficient documentation

## 2021-08-13 DIAGNOSIS — Z79899 Other long term (current) drug therapy: Secondary | ICD-10-CM | POA: Insufficient documentation

## 2021-08-13 DIAGNOSIS — E785 Hyperlipidemia, unspecified: Secondary | ICD-10-CM | POA: Diagnosis not present

## 2021-08-13 DIAGNOSIS — E1165 Type 2 diabetes mellitus with hyperglycemia: Secondary | ICD-10-CM | POA: Insufficient documentation

## 2021-08-13 DIAGNOSIS — K219 Gastro-esophageal reflux disease without esophagitis: Secondary | ICD-10-CM | POA: Insufficient documentation

## 2021-08-13 DIAGNOSIS — Z1211 Encounter for screening for malignant neoplasm of colon: Secondary | ICD-10-CM

## 2021-08-13 DIAGNOSIS — Z7984 Long term (current) use of oral hypoglycemic drugs: Secondary | ICD-10-CM | POA: Insufficient documentation

## 2021-08-13 DIAGNOSIS — M1990 Unspecified osteoarthritis, unspecified site: Secondary | ICD-10-CM | POA: Insufficient documentation

## 2021-08-13 HISTORY — PX: COLONOSCOPY WITH PROPOFOL: SHX5780

## 2021-08-13 LAB — GLUCOSE, CAPILLARY: Glucose-Capillary: 174 mg/dL — ABNORMAL HIGH (ref 70–99)

## 2021-08-13 SURGERY — COLONOSCOPY WITH PROPOFOL
Anesthesia: General

## 2021-08-13 MED ORDER — PROPOFOL 10 MG/ML IV BOLUS
INTRAVENOUS | Status: DC | PRN
  Administered 2021-08-13: 100 mg via INTRAVENOUS

## 2021-08-13 MED ORDER — SODIUM CHLORIDE 0.9 % IV SOLN: INTRAVENOUS | Status: DC

## 2021-08-13 MED ORDER — PROPOFOL 500 MG/50ML IV EMUL
INTRAVENOUS | Status: DC | PRN
  Administered 2021-08-13: 125 ug/kg/min via INTRAVENOUS

## 2021-08-13 MED ORDER — EPHEDRINE SULFATE (PRESSORS) 50 MG/ML IJ SOLN
INTRAMUSCULAR | Status: DC | PRN
  Administered 2021-08-13: 10 mg via INTRAVENOUS

## 2021-08-13 MED ORDER — LIDOCAINE HCL (CARDIAC) PF 100 MG/5ML IV SOSY
PREFILLED_SYRINGE | INTRAVENOUS | Status: DC | PRN
  Administered 2021-08-13: 50 mg via INTRAVENOUS

## 2021-08-13 MED ORDER — PROPOFOL 1000 MG/100ML IV EMUL
INTRAVENOUS | Status: AC
  Filled 2021-08-13: qty 100

## 2021-08-13 NOTE — H&P (Signed)
[name], MD [address], [place], [place], [place], [address] [address], [place], [place], [place], [address] Phone: [phone]  Fax: [phone]  Primary Care Physician:  [facility]   Pre-Procedure History & Physical: HPI:  Raymond Michael is a 66 y.o. male is here for an colonoscopy.   Past Medical History:  Diagnosis Date   Arthritis    Cancer ([place])    Diabetes ([place])    GERD (gastroesophageal reflux disease)    Hyperlipidemia     Past Surgical History:  Procedure Laterality Date   COLONOSCOPY     NOSE SURGERY     RADIOACTIVE SEED IMPLANT N/A 02/22/2021   Procedure: RADIOACTIVE SEED IMPLANT/BRACHYTHERAPY IMPLANT;  Surgeon: Hollice Espy, MD;  Location: [facility] ORS;  Service: Urology;  Laterality: N/A;   TONSILLECTOMY AND ADENOIDECTOMY      Prior to Admission medications   Medication Sig Start Date End Date Taking? Authorizing Provider  Calcium Carb-Cholecalciferol (CALCIUM + D3 PO) Take 2 tablets by mouth in the morning.    [provider]  Cholecalciferol (VITAMIN D3 PO) Take 1 tablet by mouth in the morning.    [provider]  clobetasol cream (TEMOVATE) 9.32 % Apply 1 application topically 2 (two) times daily as needed. 01/20/21   [provider]  diclofenac (CATAFLAM) 50 MG tablet Take 50-100 mg by mouth See admin instructions. Take 2 tablets (100 mg) by mouth in the morning & take 1 tablet (50 mg) by mouth in the evening. 01/14/21   [provider]  diclofenac (VOLTAREN) 50 MG EC tablet Take 50 mg by mouth See admin instructions. Take 2 tablets (100 mg) by mouth in the morning & take 1 tablet (50 mg) by mouth at night.    [provider]  docusate sodium (COLACE) 100 MG capsule TAKE 1 CAPSULE BY MOUTH TWICE A DAY 03/29/21   Hollice Espy, MD  gabapentin (NEURONTIN) 100 MG capsule Take 1 capsule by mouth twice daily 08/10/19   Jerrol Banana., MD  glipiZIDE (GLUCOTROL) 5 MG tablet TAKE 1  TABLET BY MOUTH ONCE DAILY BEFORE BREAKFAST 11/22/19   Jerrol Banana., MD  HYDROcodone-acetaminophen (NORCO/VICODIN) 5-325 MG tablet Take 1-2 tablets by mouth every 6 (six) hours as needed for moderate pain. 02/22/21   Hollice Espy, MD  ibuprofen (ADVIL) 200 MG tablet Take 400 mg by mouth every 8 (eight) hours as needed (for pain.).    [provider]  metFORMIN (GLUCOPHAGE) 1000 MG tablet TAKE 1 TABLET BY MOUTH TWICE DAILY WITH MEALS . APPOINTMENT REQUIRED FOR FUTURE REFILLS 06/18/19   Jerrol Banana., MD  Na Sulfate-K Sulfate-Mg Sulf 17.5-3.13-1.6 GM/177ML SOLN Take by mouth as directed. 07/14/21   [provider]  naproxen (NAPROSYN) 500 MG tablet TAKE 1 TABLET BY MOUTH TWICE A DAY AS NEEDED 01/25/18   Jerrol Banana., MD  Omega-3 Fatty Acids (FISH OIL PO) Take 2 capsules by mouth in the morning and at bedtime.    [provider]  OZEMPIC, 2 MG/DOSE, 8 MG/3ML SOPN Inject into the skin. 07/26/21   [provider]  pioglitazone (ACTOS) 15 MG tablet TAKE 1 TABLET BY MOUTH EVERY DAY 08/14/19   Jerrol Banana., MD  rosuvastatin (CRESTOR) 10 MG tablet TAKE 1 TABLET BY MOUTH EVERY DAY 02/18/19   Jerrol Banana., MD  RYBELSUS 7 MG TABS PLEASE SEE ATTACHED FOR DETAILED DIRECTIONS 11/10/20   [provider]  sildenafil (  VIAGRA) 100 MG tablet Take 1 tablet (100 mg total) by mouth daily as needed for erectile dysfunction. Take one tab one hour prior to intercourse 12/21/20   Abbie Sons, MD  tamsulosin (FLOMAX) 0.4 MG CAPS capsule Take 1 capsule (0.4 mg total) by mouth daily. 06/18/21   Vaillancourt, Samantha, PA-C  Vibegron (GEMTESA) 75 MG TABS Take 75 mg by mouth daily. 08/09/21   Vaillancourt, Samantha, PA-C  XELJANZ 5 MG TABS Take 5 mg by mouth 2 (two) times daily. 11/13/20   [provider]    Allergies as of 07/14/2021   (No Known Allergies)    Family History  Problem Relation Age of Onset   Diabetes Father     Diabetes Brother     Social History   Socioeconomic History   Marital status: Divorced    Spouse name: Not on file   Number of children: Not on file   Years of education: Not on file   Highest education level: Not on file  Occupational History   Not on file  Tobacco Use   Smoking status: Never    Passive exposure: Never   Smokeless tobacco: Never  Vaping Use   Vaping Use: Never used  Substance and Sexual Activity   Alcohol use: Yes    Comment: 1-2 drinks per month   Drug use: No   Sexual activity: Not on file  Other Topics Concern   Not on file  Social History Narrative   Not on file   Social Determinants of Health   Financial Resource Strain: Not on file  Food Insecurity: Not on file  Transportation Needs: Not on file  Physical Activity: Not on file  Stress: Not on file  Social Connections: Not on file  Intimate Partner Violence: Not on file    Review of Systems: See HPI, otherwise negative ROS  Physical Exam: BP (!) 143/79   Pulse 60   Temp (!) 97.1 F (36.2 C) (Temporal)   Resp 18   Ht '6\' 1"'$  (1.854 m)   Wt 124.7 kg   SpO2 98%   BMI 36.28 kg/m  General:   Alert,  pleasant and cooperative in NAD Head:  Normocephalic and atraumatic. Neck:  Supple; no masses or thyromegaly. Lungs:  Clear throughout to auscultation, normal respiratory effort.    Heart:  +S1, +S2, Regular rate and rhythm, No edema. Abdomen:  Soft, nontender and nondistended. Normal bowel sounds, without guarding, and without rebound.   Neurologic:  Alert and  oriented x4;  grossly normal neurologically.  Impression/Plan: Raymond Michael is here for an colonoscopy to be performed for Screening colonoscopy average risk   Risks, benefits, limitations, and alternatives regarding  colonoscopy have been reviewed with the patient.  Questions have been answered.  All parties agreeable.   [name], MD  08/13/2021, 9:59 AM

## 2021-08-13 NOTE — Op Note (Signed)
[facility] Gastroenterology Patient Name: Raymond Michael Procedure Date: 08/13/2021 10:07 AM MRN: [id number] Account #: [id number] Date of Birth: [birth year] Admit Type: Outpatient Age: 66 Room: [facility] ENDO ROOM 4 Gender: Male Note Status: Finalized Instrument Name: Jasper Riling [id number] Procedure:             Colonoscopy Indications:           Screening for colorectal malignant neoplasm Providers:             [name] MD, MD Referring MD:          No Local Md, MD (Referring MD) Medicines:             Monitored Anesthesia Care Complications:         No immediate complications. Procedure:             Pre-Anesthesia Assessment:                        - Prior to the procedure, a History and Physical was                         performed, and patient medications, allergies and                         sensitivities were reviewed. The patient's tolerance                         of previous anesthesia was reviewed.                        - The risks and benefits of the procedure and the                         sedation options and risks were discussed with the                         patient. All questions were answered and informed                         consent was obtained.                        - ASA Grade Assessment: II - A patient with mild                         systemic disease.                        After obtaining informed consent, the colonoscope was                         passed under direct vision. Throughout the procedure,                         the patient's blood pressure, pulse, and oxygen                         saturations were monitored continuously. The                         Colonoscope was  introduced through the anus and                         advanced to the the cecum, identified by the                         appendiceal orifice. The colonoscopy was performed                         with ease. The patient tolerated the procedure well.                          The quality of the bowel preparation was adequate. Findings:      The entire examined colon appeared normal on direct and retroflexion       views. Impression:            - The entire examined colon is normal on direct and                         retroflexion views.                        - No specimens collected. Recommendation:        - Discharge patient to home (with escort).                        - Resume previous diet.                        - Continue present medications.                        - Repeat colonoscopy in 10 years for screening                         purposes. Procedure Code(s):     --- Professional ---                        [phone], Colonoscopy, flexible; diagnostic, including                         collection of specimen(s) by brushing or washing, when                         performed (separate procedure) Diagnosis Code(s):     --- Professional ---                        Z12.11, Encounter for screening for malignant neoplasm                         of colon CPT copyright 2019 American Medical Association. All rights reserved. The codes documented in this report are preliminary and upon coder review may  be revised to meet current compliance requirements. [name], MD [name] MD, MD 08/13/2021 10:40:04 AM This report has been signed electronically. Number of Addenda: 0 Note Initiated On: 08/13/2021 10:07 AM Scope Withdrawal Time: 0 hours 18 minutes 15 seconds  Total Procedure Duration: 0 hours 23 minutes 22 seconds  Estimated Blood Loss:  Estimated blood loss: none.  [facility]

## 2021-08-13 NOTE — Transfer of Care (Signed)
Immediate Anesthesia Transfer of Care Note  Patient: Raymond Michael  Procedure(s) Performed: COLONOSCOPY WITH PROPOFOL  Patient Location: PACU  Anesthesia Type:General  Level of Consciousness: awake, alert  and oriented  Airway & Oxygen Therapy: Patient Spontanous Breathing and Patient connected to nasal cannula oxygen  Post-op Assessment: Report given to RN and Post -op Vital signs reviewed and stable  Post vital signs: stable  Last Vitals:  Vitals Value Taken Time  BP 100/82 08/13/21 1042  Temp 36.4 C 08/13/21 1042  Pulse 78 08/13/21 1043  Resp 13 08/13/21 1043  SpO2 98 % 08/13/21 1043  Vitals shown include unvalidated device data.  Last Pain:  Vitals:   08/13/21 1042  TempSrc: Temporal  PainSc: 0-No pain         Complications: No notable events documented.

## 2021-08-13 NOTE — Anesthesia Postprocedure Evaluation (Signed)
Anesthesia Post Note  Patient: Raymond Michael  Procedure(s) Performed: COLONOSCOPY WITH PROPOFOL  Patient location during evaluation: PACU Anesthesia Type: General Level of consciousness: awake and oriented Pain management: pain level controlled Vital Signs Assessment: post-procedure vital signs reviewed and stable Respiratory status: spontaneous breathing and respiratory function stable Cardiovascular status: stable Anesthetic complications: no   No notable events documented.   Last Vitals:  Vitals:   08/13/21 1052 08/13/21 1102  BP: 107/61 131/69  Pulse: 72 62  Resp: 20 14  Temp:    SpO2: 97% 97%    Last Pain:  Vitals:   08/13/21 1102  TempSrc:   PainSc: 0-No pain                 VAN STAVEREN,Petula Rotolo

## 2021-08-13 NOTE — Anesthesia Preprocedure Evaluation (Signed)
Anesthesia Evaluation  Patient identified by MRN, date of birth, ID band Patient awake    Reviewed: Allergy & Precautions, NPO status , Patient's Chart, lab work & pertinent test results  Airway Mallampati: II  TM Distance: >3 FB Neck ROM: Full    Dental  (+) Implants, Teeth Intact   Pulmonary neg pulmonary ROS, sleep apnea ,    Pulmonary exam normal  + decreased breath sounds      Cardiovascular Exercise Tolerance: Good negative cardio ROS Normal cardiovascular exam Rhythm:Regular     Neuro/Psych negative neurological ROS  negative psych ROS   GI/Hepatic negative GI ROS, Neg liver ROS, GERD  ,  Endo/Other  negative endocrine ROSdiabetes, Poorly Controlled, Type 2, Oral Hypoglycemic AgentsMorbid obesity  Renal/GU negative Renal ROS  negative genitourinary   Musculoskeletal  (+) Arthritis ,   Abdominal (+) + obese,   Peds negative pediatric ROS (+)  Hematology negative hematology ROS (+)   Anesthesia Other Findings Past Medical History: No date: Arthritis No date: Cancer ([place]) No date: Diabetes ([place]) No date: GERD (gastroesophageal reflux disease) No date: Hyperlipidemia  Past Surgical History: No date: COLONOSCOPY No date: NOSE SURGERY 02/22/2021: RADIOACTIVE SEED IMPLANT; N/A     Comment:  Procedure: RADIOACTIVE SEED IMPLANT/BRACHYTHERAPY               IMPLANT;  Surgeon: Hollice Espy, MD;  Location: [facility]               ORS;  Service: Urology;  Laterality: N/A; No date: TONSILLECTOMY AND ADENOIDECTOMY  BMI    Body Mass Index: 36.28 kg/m      Reproductive/Obstetrics negative OB ROS                             Anesthesia Physical Anesthesia Plan  ASA: 3  Anesthesia Plan: General   Post-op Pain Management:    Induction: Intravenous  PONV Risk Score and Plan: Propofol infusion and TIVA  Airway Management Planned: Natural Airway  Additional Equipment:   Intra-op  Plan:   Post-operative Plan:   Informed Consent: I have reviewed the patients History and Physical, chart, labs and discussed the procedure including the risks, benefits and alternatives for the proposed anesthesia with the patient or authorized representative who has indicated his/her understanding and acceptance.     Dental Advisory Given  Plan Discussed with: CRNA and Surgeon  Anesthesia Plan Comments:         Anesthesia Quick Evaluation

## 2021-08-18 NOTE — Progress Notes (Signed)
Radiation Oncology Follow up Note  Name: Raymond Michael   Date:   06/28/2021 MRN:  [id number] DOB: 15-Sep-1955    This 66 y.o. male presents to the clinic today for 61-monthfollow-up status post I-125 interstitial implant and a boost with patient a Gleason 7 (4+3) adenocarcinoma the prostate presenting with a PSA of 93.  REFERRING PROVIDER: [facility]  HPI: Patient is a 66year old male now out 4 months having completed both external beam treatment to his prostate and pelvic nodes plus an I-125 interstitial implant to his prostate for Gleason 7 adenocarcinoma the prostate presenting with a PSA of 93 seen today in routine follow-up he is doing well specifically denies increased lower urinary tract symptoms diarrhea or fatigue..[name] [facility].01 he continues on ADT therapy  COMPLICATIONS OF TREATMENT: none  FOLLOW UP COMPLIANCE: keeps appointments   PHYSICAL EXAM:  BP 128/71   Pulse 68   Temp (!) 96.7 F (35.9 C)   Ht '6\' 1"'$  (1.854 m)   Wt 283 lb 9.6 oz (128.6 kg)   BMI 37.42 kg/m  Well-developed well-nourished patient in NAD. HEENT reveals PERLA, EOMI, discs not visualized.  Oral cavity is clear. No oral mucosal lesions are identified. Neck is clear without evidence of cervical or supraclavicular adenopathy. Lungs are clear to A&P. Cardiac examination is essentially unremarkable with regular rate and rhythm without murmur rub or thrill. Abdomen is benign with no organomegaly or masses noted. Motor sensory and DTR levels are equal and symmetric in the upper and lower extremities. Cranial nerves II through XII are grossly intact. Proprioception is intact. No peripheral adenopathy or edema is identified. No motor or sensory levels are noted. Crude visual fields are within normal range.  RADIOLOGY RESULTS: No current films for review  PLAN: Present time patient is under excellent biochemical control of his prostate cancer.  And pleased with  his overall progress.  We will see him back in 6 months with a repeat PSA at that time.  Patient knows to call with any concerns.  I would like to take this opportunity to thank you for allowing me to participate in the care of your patient..Noreene Filbert MD

## 2021-12-06 DIAGNOSIS — L409 Psoriasis, unspecified: Secondary | ICD-10-CM | POA: Insufficient documentation

## 2021-12-06 DIAGNOSIS — L4050 Arthropathic psoriasis, unspecified: Secondary | ICD-10-CM | POA: Insufficient documentation

## 2021-12-21 ENCOUNTER — Inpatient Hospital Stay: Payer: 59 | Attending: Radiation Oncology

## 2021-12-21 ENCOUNTER — Other Ambulatory Visit: Payer: Self-pay | Admitting: *Deleted

## 2021-12-21 DIAGNOSIS — C61 Malignant neoplasm of prostate: Secondary | ICD-10-CM | POA: Diagnosis present

## 2021-12-21 LAB — PSA: Prostatic Specific Antigen: <0.01 ng/mL (ref 0.00–4.00)

## 2021-12-28 ENCOUNTER — Encounter: Payer: Self-pay | Admitting: Radiation Oncology

## 2021-12-28 ENCOUNTER — Other Ambulatory Visit: Payer: Self-pay | Admitting: *Deleted

## 2021-12-28 ENCOUNTER — Ambulatory Visit
Admission: RE | Admit: 2021-12-28 | Discharge: 2021-12-28 | Disposition: A | Discharge status: Home / Self Care | Payer: 59 | Source: Ambulatory Visit | Attending: Radiation Oncology | Admitting: Radiation Oncology

## 2021-12-28 DIAGNOSIS — R300 Dysuria: Secondary | ICD-10-CM | POA: Insufficient documentation

## 2021-12-28 DIAGNOSIS — C61 Malignant neoplasm of prostate: Secondary | ICD-10-CM

## 2021-12-28 DIAGNOSIS — Z923 Personal history of irradiation: Secondary | ICD-10-CM | POA: Insufficient documentation

## 2021-12-28 DIAGNOSIS — K5900 Constipation, unspecified: Secondary | ICD-10-CM | POA: Insufficient documentation

## 2021-12-28 NOTE — Progress Notes (Signed)
Radiation Oncology Follow up Note  Name: Raymond Michael   Date:   12/28/2021 MRN:  [id number] DOB: [birth year]    This 66 y.o. male presents to the clinic today for 70-monthfollow-up status post I-125 interstitial implant as a boost in patient receiving external beam treatment to his prostate and pelvic nodes for Gleason 7 (4+3) adenocarcinoma presenting with a PSA of 93.  REFERRING PROVIDER: [facility]  HPI: Patient is a 66 year old male now out 66 months having completed I-125 interstitial implant for boost along with external beam radiation therapy to his prostate and pelvic nodes for Gleason 7 adenocarcinoma the prostate presenting with elevated PSA of 93.  Seen today in routine follow-up he is doing well.  Does have some constipation.  Also also having some burning on urination I have suggested some Azo for that.  His most recent PSA is less than 0.01 he is still on.  Eligard ADT therapy  COMPLICATIONS OF TREATMENT: none  FOLLOW UP COMPLIANCE: keeps appointments   PHYSICAL EXAM:  BP (P) 139/77 (BP Location: Left Arm, Patient Position: Sitting)   Pulse (P) 68   Temp (!) (P) 97.2 F (36.2 C) (Tympanic)   Resp (P) 18   Ht (P) '6\' 1"'$  (1.854 m)   Wt (P) 263 lb (119.3 kg)   SpO2 (P) 100%   BMI (P) 34.70 kg/m  Well-developed well-nourished patient in NAD. HEENT reveals PERLA, EOMI, discs not visualized.  Oral cavity is clear. No oral mucosal lesions are identified. Neck is clear without evidence of cervical or supraclavicular adenopathy. Lungs are clear to A&P. Cardiac examination is essentially unremarkable with regular rate and rhythm without murmur rub or thrill. Abdomen is benign with no organomegaly or masses noted. Motor sensory and DTR levels are equal and symmetric in the upper and lower extremities. Cranial nerves II through XII are grossly intact. Proprioception is intact. No peripheral adenopathy or edema is identified. No motor or sensory levels are noted. Crude  visual fields are within normal range.  RADIOLOGY RESULTS: No current films for review  PLAN: Present time patient is under excellent biochemical control of his prostate cancer.  I have suggested some Azo for his dysuria.  He is currently on Flomax is having some urgency which may be exacerbated by the Flomax.  I have asked to see him back in 6 months with a repeat PSA continues close follow-up care with urology.  Patient is to call with any concerns.  I would like to take this opportunity to thank you for allowing me to participate in the care of your patient..Raymond Filbert MD

## 2022-01-24 ENCOUNTER — Telehealth: Payer: Self-pay

## 2022-01-24 NOTE — Telephone Encounter (Signed)
Eligard PA form faxed to [employer]. Awaiting response.  

## 2022-01-25 ENCOUNTER — Ambulatory Visit (INDEPENDENT_AMBULATORY_CARE_PROVIDER_SITE_OTHER): Payer: 59 | Admitting: Physician Assistant

## 2022-01-25 ENCOUNTER — Telehealth: Payer: Self-pay | Admitting: *Deleted

## 2022-01-25 DIAGNOSIS — C61 Malignant neoplasm of prostate: Secondary | ICD-10-CM | POA: Diagnosis not present

## 2022-01-25 MED ORDER — LEUPROLIDE ACETATE (6 MONTH) 45 MG ~~LOC~~ KIT
45.0000 mg | PACK | Freq: Once | SUBCUTANEOUS | Status: AC
  Administered 2022-01-25: 45 mg via SUBCUTANEOUS

## 2022-01-25 NOTE — Telephone Encounter (Signed)
Incoming Eligard approval 01/24/22 - 01/24/23. See media.

## 2022-01-25 NOTE — Telephone Encounter (Signed)
Approved Eligard 01/24/2022-01/24/2023 per fax

## 2022-01-25 NOTE — Progress Notes (Signed)
Eligard SubQ Injection    Due to Prostate Cancer patient is present today for a Eligard Injection.   Medication: Eligard 6 month Dose: 45 mg  Location: right  Lot: [id number] Exp: 02/2023   Patient tolerated well, no complications were noted   Performed by: Gaspar Cola CMA   Per Dr. Donella Stade patient is to continue therapy for 2 years . Patient's next follow up was scheduled for 08/01/2022  This appointment was scheduled using wheel and given to patient today along with reminder continue on Vitamin D 800-1000iu and Calcium 1000-'1200mg'$  daily while on Androgen Deprivation Therapy.  PA approval dates:  Expires in 01/24/2023

## 2022-04-03 ENCOUNTER — Other Ambulatory Visit: Payer: Self-pay | Admitting: Family

## 2022-04-03 DIAGNOSIS — E119 Type 2 diabetes mellitus without complications: Secondary | ICD-10-CM

## 2022-04-04 ENCOUNTER — Telehealth: Payer: Self-pay

## 2022-04-04 NOTE — Telephone Encounter (Signed)
Pt called regarding rx ozempic, asked if we can up the dose of rx. I told him I would ask you about this since he is on the highest dose currently, please advise

## 2022-04-06 ENCOUNTER — Other Ambulatory Visit: Payer: Self-pay | Admitting: Urology

## 2022-04-06 MED ORDER — MOUNJARO 5 MG/0.5ML ~~LOC~~ SOAJ
5.0000 mg | SUBCUTANEOUS | 3 refills | Status: DC
Start: 2022-04-06 — End: 2022-06-21

## 2022-04-06 NOTE — Addendum Note (Signed)
Addended by: Georgian Co on: 04/06/2022 11:19 AM   Modules accepted: Orders

## 2022-04-07 NOTE — Telephone Encounter (Signed)
Patient was seen by Sam the last 2 times, I spoke with patient and he states he has been taking Oxybutynin and Gemtesa but per last notes it looks like patient should just be on [place] Advised patient I would check with the provider on how to proceed. He states his urination at night and day time is much improved. No concerns at this time. He does have scheduled follow up with Sam in June.

## 2022-04-15 ENCOUNTER — Telehealth: Payer: Self-pay

## 2022-04-19 NOTE — Telephone Encounter (Signed)
Spoke to pt, mounjaro rx approved

## 2022-05-17 ENCOUNTER — Other Ambulatory Visit: Payer: Self-pay | Admitting: Physician Assistant

## 2022-05-17 ENCOUNTER — Other Ambulatory Visit: Payer: Self-pay | Admitting: Family

## 2022-05-17 DIAGNOSIS — R399 Unspecified symptoms and signs involving the genitourinary system: Secondary | ICD-10-CM

## 2022-05-17 DIAGNOSIS — R3911 Hesitancy of micturition: Secondary | ICD-10-CM

## 2022-06-17 ENCOUNTER — Ambulatory Visit: Payer: 59 | Admitting: Family

## 2022-06-21 ENCOUNTER — Ambulatory Visit: Payer: 59 | Admitting: Family

## 2022-06-21 ENCOUNTER — Other Ambulatory Visit: Payer: Self-pay

## 2022-06-21 ENCOUNTER — Encounter: Payer: Self-pay | Admitting: Family

## 2022-06-21 ENCOUNTER — Inpatient Hospital Stay: Payer: 59 | Attending: Radiation Oncology

## 2022-06-21 VITALS — BP 112/68 | HR 78 | Ht 73.0 in | Wt 285.0 lb

## 2022-06-21 DIAGNOSIS — E559 Vitamin D deficiency, unspecified: Secondary | ICD-10-CM

## 2022-06-21 DIAGNOSIS — E1165 Type 2 diabetes mellitus with hyperglycemia: Secondary | ICD-10-CM

## 2022-06-21 DIAGNOSIS — E538 Deficiency of other specified B group vitamins: Secondary | ICD-10-CM | POA: Diagnosis not present

## 2022-06-21 DIAGNOSIS — C61 Malignant neoplasm of prostate: Secondary | ICD-10-CM | POA: Diagnosis present

## 2022-06-21 DIAGNOSIS — E7800 Pure hypercholesterolemia, unspecified: Secondary | ICD-10-CM | POA: Diagnosis not present

## 2022-06-21 DIAGNOSIS — I10 Essential (primary) hypertension: Secondary | ICD-10-CM

## 2022-06-21 DIAGNOSIS — Z6835 Body mass index (BMI) 35.0-35.9, adult: Secondary | ICD-10-CM

## 2022-06-21 DIAGNOSIS — M459 Ankylosing spondylitis of unspecified sites in spine: Secondary | ICD-10-CM

## 2022-06-21 DIAGNOSIS — R5383 Other fatigue: Secondary | ICD-10-CM

## 2022-06-21 DIAGNOSIS — L4050 Arthropathic psoriasis, unspecified: Secondary | ICD-10-CM

## 2022-06-21 DIAGNOSIS — E1140 Type 2 diabetes mellitus with diabetic neuropathy, unspecified: Secondary | ICD-10-CM

## 2022-06-21 LAB — POCT CBG (FASTING - GLUCOSE)-MANUAL ENTRY: Glucose Fasting, POC: 104 mg/dL — AB (ref 70–99)

## 2022-06-21 LAB — PSA: Prostatic Specific Antigen: <0.01 ng/mL (ref 0.00–4.00)

## 2022-06-21 MED ORDER — MOUNJARO 5 MG/0.5ML ~~LOC~~ SOAJ
5.0000 mg | SUBCUTANEOUS | 3 refills | Status: DC
  Filled 2022-06-21: qty 2, 28d supply, fill #0

## 2022-06-22 ENCOUNTER — Encounter: Payer: Self-pay | Admitting: Family

## 2022-06-23 ENCOUNTER — Encounter: Payer: Self-pay | Admitting: Family

## 2022-06-23 DIAGNOSIS — M459 Ankylosing spondylitis of unspecified sites in spine: Secondary | ICD-10-CM | POA: Insufficient documentation

## 2022-06-23 DIAGNOSIS — C61 Malignant neoplasm of prostate: Secondary | ICD-10-CM | POA: Insufficient documentation

## 2022-06-23 NOTE — Assessment & Plan Note (Addendum)
>>  ASSESSMENT AND PLAN FOR TYPE 2 DIABETES MELLITUS WITH DIABETIC NEUROPATHY, WITHOUT LONG-TERM CURRENT USE OF INSULIN (HCC) WRITTEN ON 06/23/2022  4:22 PM BY Miki Kins, FNP  Checking labs - patient will come in next week to have drawn. ww Will call with results  Continue current diabetes POC, as patient has been well controlled on current regimen.  Will adjust meds if needed based on labs.    >>ASSESSMENT AND PLAN FOR DIABETES (HCC) WRITTEN ON 06/23/2022  4:22 PM BY Miki Kins, FNP  Checking labs today. Will call pt. With results  Continue current diabetes POC, as patient has been well controlled on current regimen.  Will adjust meds if needed based on labs.

## 2022-06-23 NOTE — Assessment & Plan Note (Signed)
Continue current meds.  Will adjust as needed based on results.  The patient is asked to make an attempt to improve diet and exercise patterns to aid in medical management of this problem. Addressed importance of increasing and maintaining water intake.   

## 2022-06-23 NOTE — Assessment & Plan Note (Signed)
Patient is under the care of Rheumatology.  Will continue to defer to them for treatment.  Patient its doing well with current therapy.  

## 2022-06-23 NOTE — Assessment & Plan Note (Signed)
Checking labs today. Will call pt. With results  Continue current diabetes POC, as patient has been well controlled on current regimen.  Will adjust meds if needed based on labs.  

## 2022-06-23 NOTE — Assessment & Plan Note (Signed)
Patient is under the care of Oncology.  Will continue to defer to them for treatment.  Patient its doing well with current therapy.

## 2022-06-23 NOTE — Progress Notes (Signed)
Established Patient Office Visit  Subjective:  Patient ID: Raymond Michael, male    DOB: 03-05-1955  Age: 67 y.o. MRN: [id number]  Chief Complaint  Patient presents with   Follow-up    6 month follow up    Patient is here today for his 6 months follow up.  He has been feeling fairly well since last appointment.   He does not have additional concerns to discuss today.  Labs are due today. He needs refills.   I have reviewed his active problem list, medication list, allergies, notes from last encounter, lab results for his appointment today.      No other concerns at this time.   Past Medical History:  Diagnosis Date   Arthritis    Cancer (HCC)    Diabetes (HCC)    GERD (gastroesophageal reflux disease)    Hyperlipidemia     Past Surgical History:  Procedure Laterality Date   COLONOSCOPY     COLONOSCOPY WITH PROPOFOL N/A 08/13/2021   Procedure: COLONOSCOPY WITH PROPOFOL;  Surgeon: Wyline Mood, MD;  Location: [facility] ENDOSCOPY;  Service: Gastroenterology;  Laterality: N/A;   NOSE SURGERY     RADIOACTIVE SEED IMPLANT N/A 02/22/2021   Procedure: RADIOACTIVE SEED IMPLANT/BRACHYTHERAPY IMPLANT;  Surgeon: Vanna Scotland, MD;  Location: [facility] ORS;  Service: Urology;  Laterality: N/A;   TONSILLECTOMY AND ADENOIDECTOMY      Social History   Socioeconomic History   Marital status: Divorced    Spouse name: Not on file   Number of children: Not on file   Years of education: Not on file   Highest education level: Not on file  Occupational History   Not on file  Tobacco Use   Smoking status: Never    Passive exposure: Never   Smokeless tobacco: Never  Vaping Use   Vaping Use: Never used  Substance and Sexual Activity   Alcohol use: Yes    Comment: 1-2 drinks per month   Drug use: No   Sexual activity: Not on file  Other Topics Concern   Not on file  Social History Narrative   Not on file   Social Determinants of Health   Financial Resource Strain: Not on file   Food Insecurity: Not on file  Transportation Needs: Not on file  Physical Activity: Not on file  Stress: Not on file  Social Connections: Not on file  Intimate Partner Violence: Not on file    Family History  Problem Relation Age of Onset   Diabetes Father    Diabetes Brother     No Known Allergies  Review of Systems  All other systems reviewed and are negative.      Objective:   BP 112/68   Pulse 78   Ht 6\' 1"  (1.854 m)   Wt 285 lb (129.3 kg)   SpO2 97%   BMI 37.60 kg/m   Vitals:   06/21/22 1101  BP: 112/68  Pulse: 78  Height: 6\' 1"  (1.854 m)  Weight: 285 lb (129.3 kg)  SpO2: 97%  BMI (Calculated): 37.61    Physical Exam Vitals and nursing note reviewed.  Constitutional:      Appearance: Normal appearance. He is normal weight.  HENT:     Head: Normocephalic and atraumatic.     Nose: Nose normal.  Eyes:     Pupils: Pupils are equal, round, and reactive to light.  Cardiovascular:     Rate and Rhythm: Normal rate and regular rhythm.     Pulses: Normal  pulses.     Heart sounds: Normal heart sounds.  Pulmonary:     Effort: Pulmonary effort is normal.     Breath sounds: Normal breath sounds.  Neurological:     General: No focal deficit present.     Mental Status: He is alert and oriented to person, place, and time.  Psychiatric:        Mood and Affect: Mood normal.        Behavior: Behavior normal.        Thought Content: Thought content normal.        Judgment: Judgment normal.      Results for orders placed or performed in visit on 06/21/22  POCT CBG (Fasting - Glucose)  Result Value Ref Range   Glucose Fasting, POC 104 (A) 70 - 99 mg/dL  Results for orders placed or performed in visit on 06/21/22  PSA  Result Value Ref Range   Prostatic Specific Antigen <0.01 0.00 - 4.00 ng/mL    Recent Results (from the past 2160 hour(s))  PSA     Status: None   Collection Time: 06/21/22  9:43 AM  Result Value Ref Range   Prostatic Specific Antigen  <0.01 0.00 - 4.00 ng/mL    Comment: (NOTE) While PSA levels of <=4.00 ng/ml are reported as reference range, some men with levels below 4.00 ng/ml can have prostate cancer and many men with PSA above 4.00 ng/ml do not have prostate cancer.  Other tests such as free PSA, age specific reference ranges, PSA velocity and PSA doubling time may be helpful especially in men less than 19 years old. Performed at [facility], [address], [place], [place] [address]   POCT CBG (Fasting - Glucose)     Status: Abnormal   Collection Time: 06/21/22 11:09 AM  Result Value Ref Range   Glucose Fasting, POC 104 (A) 70 - 99 mg/dL       Assessment & Plan:   Problem List Items Addressed This Visit       Active Problems   Diabetes (HCC) - Primary    Checking labs today. Will call pt. With results  Continue current diabetes POC, as patient has been well controlled on current regimen.  Will adjust meds if needed based on labs.       Relevant Medications   tirzepatide (MOUNJARO) 5 MG/0.5ML Pen   Other Relevant Orders   POCT CBG (Fasting - Glucose) (Completed)   CBC With Differential   CMP14+EGFR   Hemoglobin A1c   POC CREATINE & ALBUMIN,URINE   HLD (hyperlipidemia)    Checking labs.  Continue current therapy for lipid control. Will modify as needed based on labwork results.       Relevant Orders   Lipid panel   CBC With Differential   CMP14+EGFR   Class 2 severe obesity due to excess calories with serious comorbidity and body mass index (BMI) of 35.0 to 35.9 in adult [facility])    Continue current meds.  Will adjust as needed based on results.  The patient is asked to make an attempt to improve diet and exercise patterns to aid in medical management of this problem. Addressed importance of increasing and maintaining water intake.       Relevant Medications   tirzepatide [facility]) 5 MG/0.5ML Pen   Type 2 diabetes mellitus with diabetic neuropathy, without long-term current use  of insulin (HCC)    Checking labs - patient will come in next week to have drawn. ww  Will call with results  Continue current diabetes POC, as patient has been well controlled on current regimen.  Will adjust meds if needed based on labs.        Relevant Medications   tirzepatide [facility]) 5 MG/0.5ML Pen   Psoriatic arthritis (HCC)    Patient is under the care of Rheumatology.  Will continue to defer to them for treatment.  Patient its doing well with current therapy.       Prostate cancer [facility])    Patient is under the care of Oncology.  Will continue to defer to them for treatment.  Patient its doing well with current therapy.       Ankylosing spondylitis (HCC)    Patient is under the care of Rheumatology.  Will continue to defer to them for treatment.  Patient its doing well with current therapy.       Other Visit Diagnoses     B12 deficiency due to diet       Relevant Orders   CBC With Differential   CMP14+EGFR   Vitamin B12   Vitamin D deficiency, unspecified       Relevant Orders   VITAMIN D 25 Hydroxy (Vit-D Deficiency, Fractures)   CBC With Differential   CMP14+EGFR   Essential hypertension, benign       Relevant Orders   CBC With Differential   CMP14+EGFR   Other fatigue       Relevant Orders   CBC With Differential   CMP14+EGFR   TSH       Return in about 6 months (around 12/22/2022) for F/U.   Total time spent: 30 minutes  Miki Kins, FNP  06/21/2022

## 2022-06-23 NOTE — Assessment & Plan Note (Signed)
Checking labs.  Continue current therapy for lipid control. Will modify as needed based on labwork results.

## 2022-06-23 NOTE — Assessment & Plan Note (Signed)
Patient is under the care of Rheumatology.  Will continue to defer to them for treatment.  Patient its doing well with current therapy.

## 2022-06-29 ENCOUNTER — Ambulatory Visit: Payer: 59 | Admitting: Radiation Oncology

## 2022-07-11 IMAGING — CT NM PET TUM IMG SKULL BASE T - THIGH
9 series · 25 of 25 positions shown · non-contrast
Comparison: Comparison made with examination November 24, 2020,
CT assessment.

CLINICAL DATA: Recent diagnosis of prostate cancer with PSA
elevated w to 93.5, no signs of metastatic disease on previous
imaging

EXAM:
NUCLEAR MEDICINE PET SKULL BASE TO THIGH
TECHNIQUE: 9.95 mCi F18 Piflufolastat (Pylarify) was injected intravenously.
Full-ring PET imaging was performed from the skull base to thigh
after the radiotracer. CT data was obtained and used for attenuation
correction and anatomic localization.

[Series 3: ct wb 5.0 b30f · axial · 5.0mm · 0.98mm/px · z∈[-452,+649]mm · 3 of 368 slices shown]
[im 1/368]
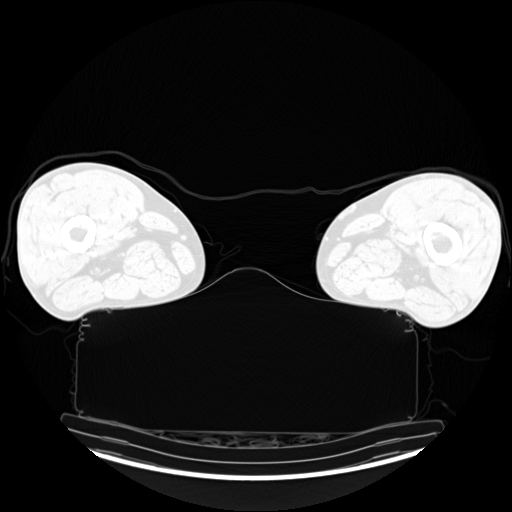
[im 184/368]
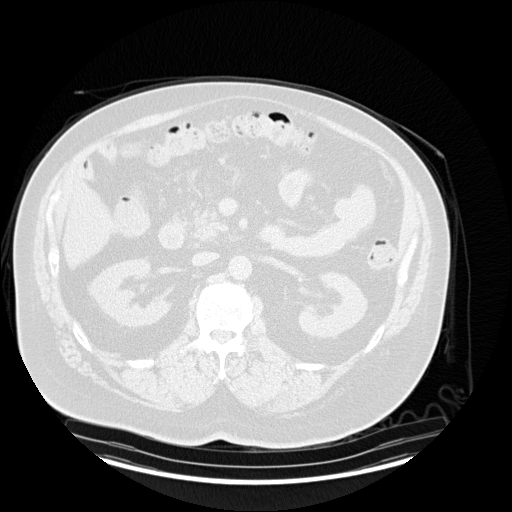
[im 368/368  brain]
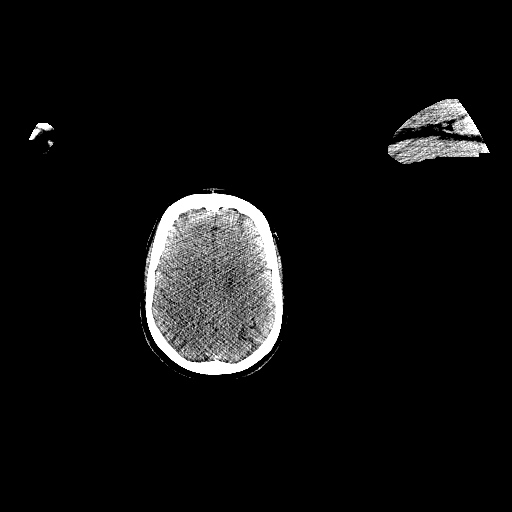

[Series 5: pet wb uncorrected (nac) · axial · 5.0mm · 4.07mm/px · z∈[-452,+649]mm · 4 of 368 slices shown]
[im 1/368]
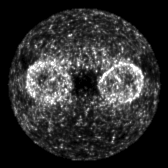
[im 123/368]
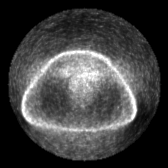
[im 245/368]
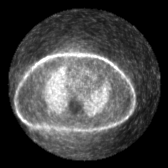
[im 368/368]
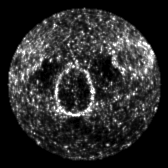

[Series 6: pet wb (ac) · axial · 5.0mm · 4.07mm/px · z∈[-452,+649]mm · 4 of 368 slices shown]
[im 1/368]
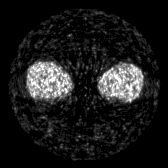
[im 123/368]
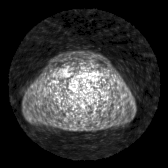
[im 245/368]
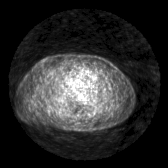
[im 368/368]
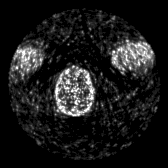

[Series 603: pet_ct axial fused · 4 of 358 slices shown]
[im 1/358]
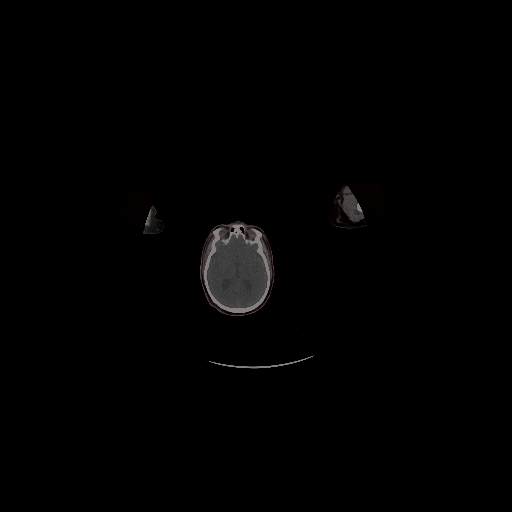
[im 120/358]
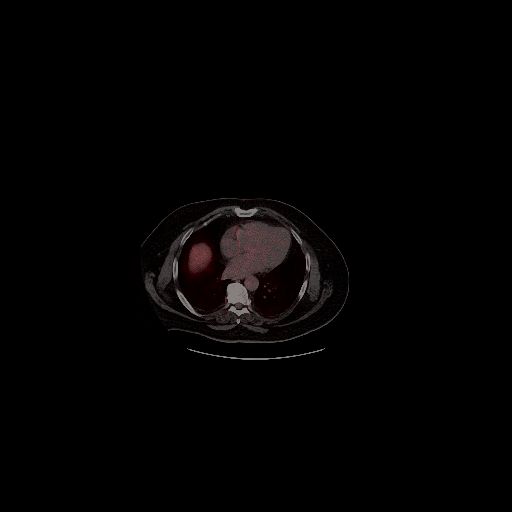
[im 239/358]
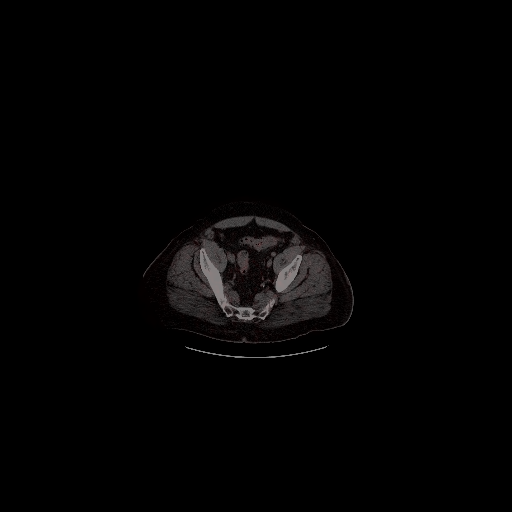
[im 358/358]
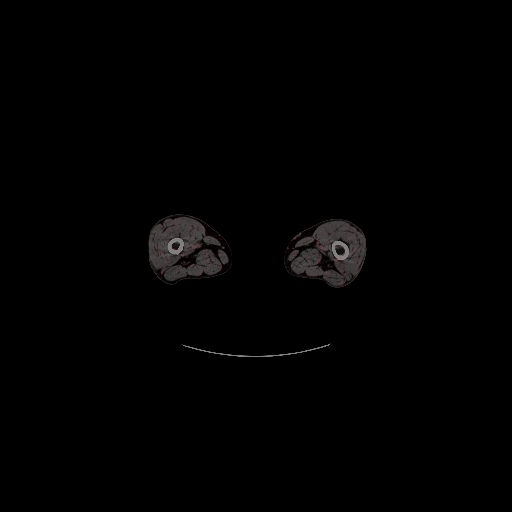

[Series 604: pet_ct coronal fused · 1 of 111 slices shown]
[im 1/111]
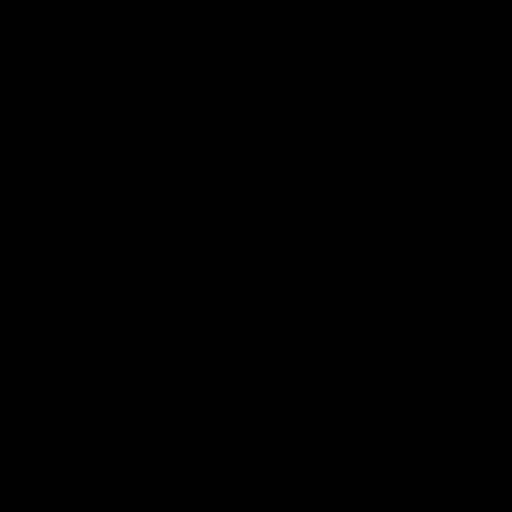

[Series 605: pet_ct sagittal fused · 2 of 173 slices shown]
[im 1/173]
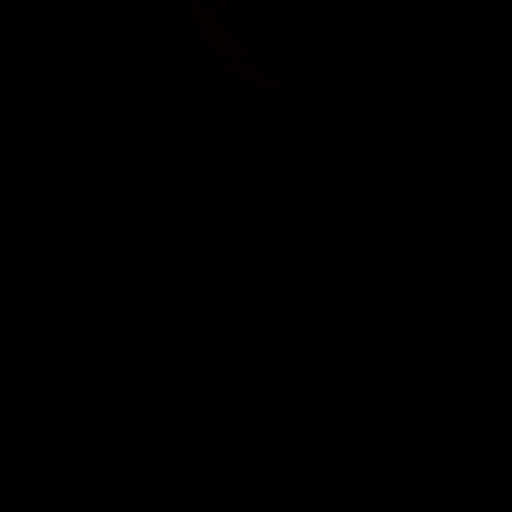
[im 173/173]
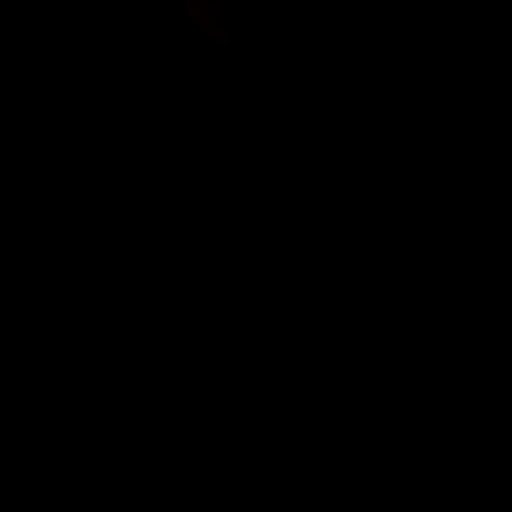

[Series 606: pet axial · 4 of 361 slices shown]
[im 1/361]
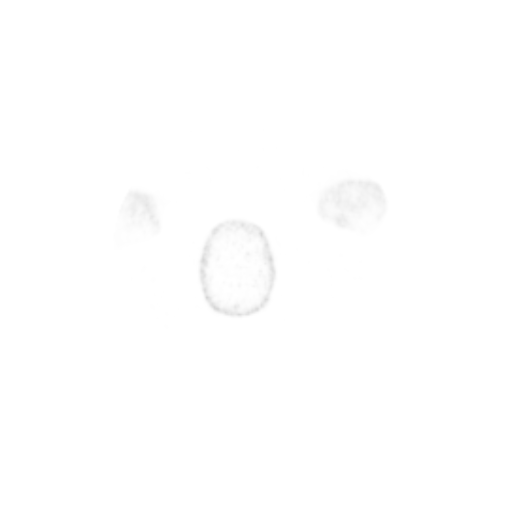
[im 121/361]
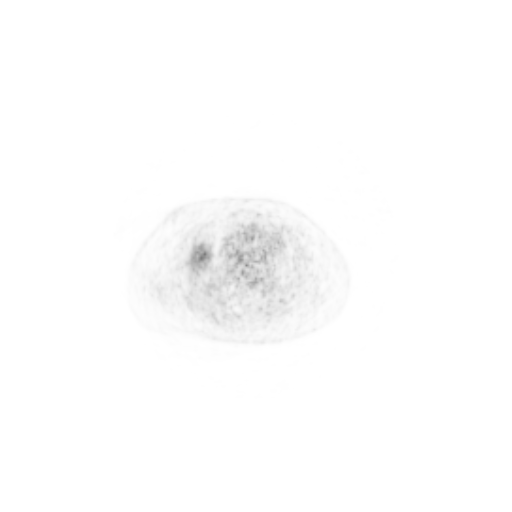
[im 241/361]
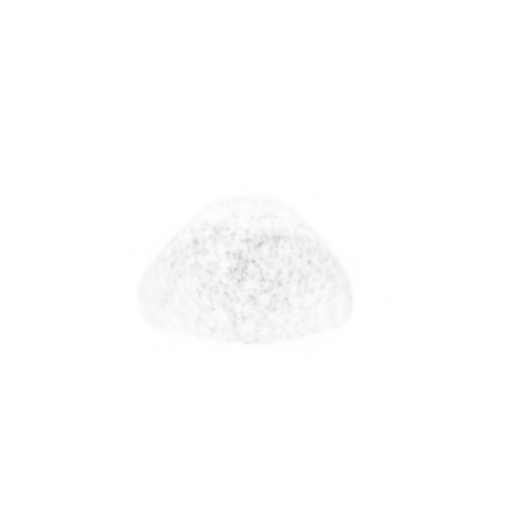
[im 361/361]
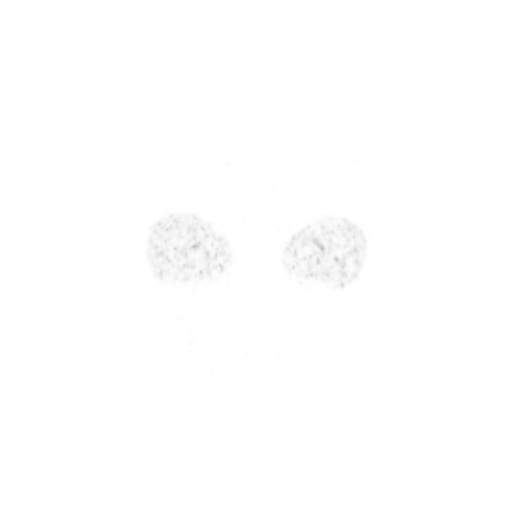

[Series 607: pet coronal · 1 of 136 slices shown]
[im 1/136]
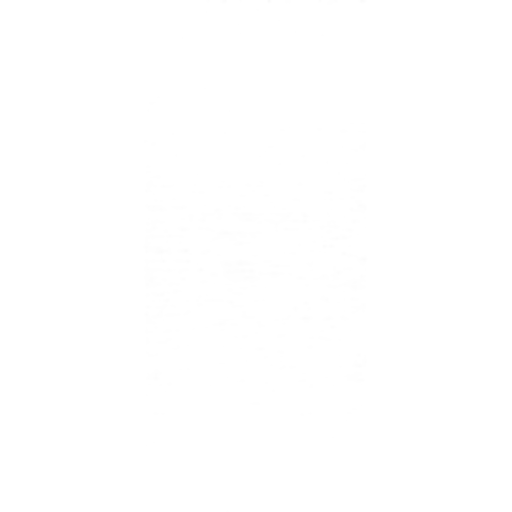

[Series 608: pet sagittal · 2 of 203 slices shown]
[im 1/203]
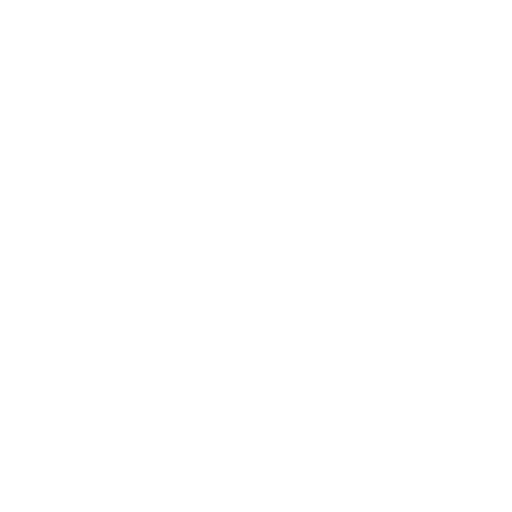
[im 203/203]
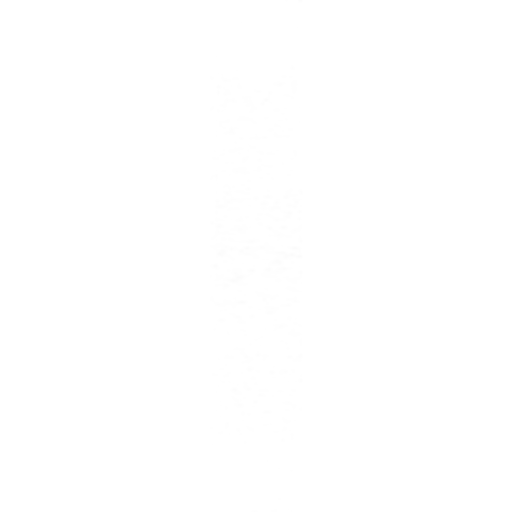

[25 of 25 positions shown; findings below may reference images not displayed]

FINDINGS: NECK

No radiotracer activity in neck lymph nodes.

Incidental CT finding: None

CHEST

No radiotracer accumulation within mediastinal or hilar lymph nodes.
No suspicious pulmonary nodules on the CT scan.

Incidental CT finding: Aortic atherosclerosis, mild and scattered
throughout the thoracic aorta. Three-vessel coronary artery disease.
Top normal heart size without substantial pericardial effusion. No
adenopathy in the chest by size criteria. No consolidation or
effusion. Lungs are clear.

ABDOMEN/PELVIS

Prostate: Marked radiotracer accumulation in the prostate bed with a
maximum SUV of 45.3. The anterior gland is more involved than
posterior gland but the involvement is diffuse

Lymph nodes: No abnormal radiotracer accumulation within pelvic or
abdominal nodes.

Liver: No evidence of liver metastasis

Incidental CT finding: Lobular hepatic contours and fissural
widening with nodular surface of the liver. Gallbladder nondistended
without pericholecystic stranding.

No acute findings relative to pancreas, spleen, adrenal glands or
the kidneys. Small gastric diverticulum. No acute gastrointestinal
process colonic diverticulosis. Mild aortic atherosclerosis with
calcification.

SKELETON

No focal  activity to suggest skeletal metastasis.
IMPRESSION: Marked radiotracer accumulation throughout nearly the entire
prostate gland more so in the anterior gland. Compatible with known
prostate cancer.

No signs of distant disease.

Coronary calcification.

Aortic Atherosclerosis ([id number]-HE0.0).

## 2022-08-01 ENCOUNTER — Ambulatory Visit: Payer: 59 | Admitting: Physician Assistant

## 2022-08-01 DIAGNOSIS — C61 Malignant neoplasm of prostate: Secondary | ICD-10-CM | POA: Diagnosis not present

## 2022-08-01 MED ORDER — LEUPROLIDE ACETATE (6 MONTH) 45 MG ~~LOC~~ KIT
45.0000 mg | PACK | Freq: Once | SUBCUTANEOUS | Status: AC
Start: 2022-08-01 — End: 2022-08-01
  Administered 2022-08-01: 45 mg via SUBCUTANEOUS

## 2022-08-01 NOTE — Progress Notes (Signed)
Eligard SubQ Injection   Due to Prostate Cancer patient is present today for a Eligard Injection.  Medication: Eligard 6 month Dose: 45 mg  Location: right  Lot: [id number] Exp: 10/16/2023  Patient tolerated well, no complications were noted  Performed by: Carman Ching, PA-C  Per Sam patient this is the last injection needed to complete 2 years of ADT. Will schedule 5 month follow up with Dr. Apolinar Junes to discuss extending an additional year. Patient's next follow up was scheduled for 11/192024. This appointment was scheduled using wheel and given to patient today along with reminder continue on Vitamin D 800-1000iu and Calcium 1000-1200mg  daily while on Androgen Deprivation Therapy.

## 2022-08-23 ENCOUNTER — Other Ambulatory Visit: Payer: Self-pay | Admitting: Family

## 2022-08-23 DIAGNOSIS — R399 Unspecified symptoms and signs involving the genitourinary system: Secondary | ICD-10-CM

## 2022-08-23 NOTE — Telephone Encounter (Signed)
Patient called in stating that he cannot afford the [facility]. Is there a generic or alternative?   He also states he thinks he is gaining weight on the [facility] - it is not helping him. He wishes to go back on the Ozempic if you're in agreement.

## 2022-08-26 MED ORDER — GEMTESA 75 MG PO TABS
1.0000 | ORAL_TABLET | Freq: Every day | ORAL | 2 refills | Status: DC
Start: 2022-08-26 — End: 2022-09-09

## 2022-08-30 ENCOUNTER — Telehealth: Payer: Self-pay | Admitting: Family

## 2022-08-30 NOTE — Telephone Encounter (Signed)
Patient called again he needs an alternative to the [place] and possibly go back on Ozempic. Going out og town Friday, can we send something before then?  Patient called in stating that he cannot afford the [facility]. Is there a generic or alternative?    He also states he thinks he is gaining weight on the [facility] - it is not helping him. He wishes to go back on the Ozempic if you're in agreement.

## 2022-09-09 ENCOUNTER — Other Ambulatory Visit: Payer: Self-pay | Admitting: Family

## 2022-09-09 ENCOUNTER — Other Ambulatory Visit: Payer: Self-pay

## 2022-09-09 MED ORDER — SEMAGLUTIDE(0.25 OR 0.5MG/DOS) 2 MG/3ML ~~LOC~~ SOPN: 0.5000 mg | PEN_INJECTOR | SUBCUTANEOUS | 1 refills | Status: DC

## 2022-09-09 MED ORDER — MIRABEGRON ER 25 MG PO TB24: 25.0000 mg | ORAL_TABLET | Freq: Every day | ORAL | 0 refills | Status: AC

## 2022-09-12 ENCOUNTER — Telehealth: Payer: Self-pay

## 2022-09-12 MED ORDER — PAXLOVID (300/100) 20 X 150 MG & 10 X 100MG PO TBPK: 3.0000 | ORAL_TABLET | Freq: Two times a day (BID) | ORAL | 0 refills | Status: DC

## 2022-09-12 NOTE — Telephone Encounter (Signed)
Sent Paxlovid  Pt. Should go to the ED if needed.

## 2022-09-12 NOTE — Telephone Encounter (Signed)
Sent in [place] and resent ozempic

## 2022-09-13 NOTE — Telephone Encounter (Signed)
Informed via my chart.

## 2022-09-20 ENCOUNTER — Telehealth: Payer: Self-pay | Admitting: Family

## 2022-09-20 DIAGNOSIS — E1165 Type 2 diabetes mellitus with hyperglycemia: Secondary | ICD-10-CM

## 2022-09-20 NOTE — Telephone Encounter (Signed)
Patient left VM needing a referral for his specialist doctor. I'm not sure what he is talking about that he needs. We need to call and get more info about what he needs and where it needs to go.

## 2022-09-22 NOTE — Telephone Encounter (Signed)
Done yesterday.

## 2022-09-26 NOTE — Telephone Encounter (Signed)
Already done

## 2022-10-13 ENCOUNTER — Other Ambulatory Visit: Payer: Self-pay | Admitting: Family

## 2022-10-30 ENCOUNTER — Other Ambulatory Visit: Payer: Self-pay | Admitting: Family

## 2022-11-08 ENCOUNTER — Other Ambulatory Visit: Payer: Self-pay | Admitting: Family

## 2022-11-08 DIAGNOSIS — E118 Type 2 diabetes mellitus with unspecified complications: Secondary | ICD-10-CM

## 2022-12-05 ENCOUNTER — Encounter: Payer: Self-pay | Admitting: Family

## 2022-12-05 ENCOUNTER — Ambulatory Visit: Payer: 59 | Admitting: Family

## 2022-12-05 VITALS — BP 110/70 | HR 64 | Ht 73.0 in | Wt 287.0 lb

## 2022-12-05 DIAGNOSIS — L4050 Arthropathic psoriasis, unspecified: Secondary | ICD-10-CM

## 2022-12-05 DIAGNOSIS — E538 Deficiency of other specified B group vitamins: Secondary | ICD-10-CM

## 2022-12-05 DIAGNOSIS — Z1159 Encounter for screening for other viral diseases: Secondary | ICD-10-CM

## 2022-12-05 DIAGNOSIS — I10 Essential (primary) hypertension: Secondary | ICD-10-CM

## 2022-12-05 DIAGNOSIS — M459 Ankylosing spondylitis of unspecified sites in spine: Secondary | ICD-10-CM

## 2022-12-05 DIAGNOSIS — E782 Mixed hyperlipidemia: Secondary | ICD-10-CM

## 2022-12-05 DIAGNOSIS — E1140 Type 2 diabetes mellitus with diabetic neuropathy, unspecified: Secondary | ICD-10-CM

## 2022-12-05 DIAGNOSIS — Z6835 Body mass index (BMI) 35.0-35.9, adult: Secondary | ICD-10-CM

## 2022-12-05 DIAGNOSIS — E66812 Obesity, class 2: Secondary | ICD-10-CM

## 2022-12-05 DIAGNOSIS — E559 Vitamin D deficiency, unspecified: Secondary | ICD-10-CM

## 2022-12-05 DIAGNOSIS — R5383 Other fatigue: Secondary | ICD-10-CM

## 2022-12-05 DIAGNOSIS — R002 Palpitations: Secondary | ICD-10-CM

## 2022-12-05 LAB — POCT CBG (FASTING - GLUCOSE)-MANUAL ENTRY: Glucose Fasting, POC: 73 mg/dL (ref 70–99)

## 2022-12-05 LAB — POC CREATINE & ALBUMIN,URINE
Albumin/Creatinine Ratio, Urine, POC: <30
Creatinine, POC: 300 mg/dL
Microalbumin Ur, POC: 30 mg/L

## 2022-12-05 NOTE — Assessment & Plan Note (Signed)
Checking labs today. Will call pt. With results  Continue current diabetes POC, as patient has been well controlled on current regimen.  Will adjust meds if needed based on labs.  

## 2022-12-05 NOTE — Progress Notes (Signed)
Established Patient Office Visit  Subjective:  Patient ID: Raymond Michael, male    DOB: [birth year]  Age: 67 y.o. MRN: [id number]  Chief Complaint  Patient presents with   Follow-up    referral    Patient is here to discuss referral, needs referral to Cardiology.   Would like to see Dr. Welton Flakes, as he has several friends who have had issues and would like to see him.  Blood sugar was low today, will half metformin dose and see how his blood sugars go.   Needs microalbumin, labs.     No other concerns at this time.   Past Medical History:  Diagnosis Date   Arthritis    Cancer (HCC)    Diabetes (HCC)    GERD (gastroesophageal reflux disease)    Hyperlipidemia     Past Surgical History:  Procedure Laterality Date   COLONOSCOPY     COLONOSCOPY WITH PROPOFOL N/A 08/13/2021   Procedure: COLONOSCOPY WITH PROPOFOL;  Surgeon: Wyline Mood, MD;  Location: [facility] ENDOSCOPY;  Service: Gastroenterology;  Laterality: N/A;   NOSE SURGERY     RADIOACTIVE SEED IMPLANT N/A 02/22/2021   Procedure: RADIOACTIVE SEED IMPLANT/BRACHYTHERAPY IMPLANT;  Surgeon: Vanna Scotland, MD;  Location: [facility] ORS;  Service: Urology;  Laterality: N/A;   TONSILLECTOMY AND ADENOIDECTOMY      Social History   Socioeconomic History   Marital status: Divorced    Spouse name: Not on file   Number of children: Not on file   Years of education: Not on file   Highest education level: Not on file  Occupational History   Not on file  Tobacco Use   Smoking status: Never    Passive exposure: Never   Smokeless tobacco: Never  Vaping Use   Vaping status: Never Used  Substance and Sexual Activity   Alcohol use: Yes    Comment: 1-2 drinks per month   Drug use: No   Sexual activity: Not on file  Other Topics Concern   Not on file  Social History Narrative   Not on file   Social Determinants of Health   Financial Resource Strain: Not on file  Food Insecurity: Not on file  Transportation Needs: Not on file   Physical Activity: Not on file  Stress: Not on file  Social Connections: Not on file  Intimate Partner Violence: Not on file    Family History  Problem Relation Age of Onset   Diabetes Father    Diabetes Brother     No Known Allergies  Review of Systems  All other systems reviewed and are negative.      Objective:   BP 110/70   Pulse 64   Ht 6\' 1"  (1.854 m)   Wt 287 lb (130.2 kg)   SpO2 98%   BMI 37.87 kg/m   Vitals:   12/05/22 1149  BP: 110/70  Pulse: 64  Height: 6\' 1"  (1.854 m)  Weight: 287 lb (130.2 kg)  SpO2: 98%  BMI (Calculated): 37.87    Physical Exam Vitals and nursing note reviewed.  Constitutional:      Appearance: Normal appearance. He is normal weight.  Eyes:     Pupils: Pupils are equal, round, and reactive to light.  Cardiovascular:     Rate and Rhythm: Normal rate and regular rhythm.     Pulses: Normal pulses.     Heart sounds: Normal heart sounds.  Pulmonary:     Effort: Pulmonary effort is normal.     Breath sounds:  Normal breath sounds.  Neurological:     General: No focal deficit present.     Mental Status: He is alert and oriented to person, place, and time. Mental status is at baseline.  Psychiatric:        Mood and Affect: Mood normal.        Behavior: Behavior normal.        Thought Content: Thought content normal.        Judgment: Judgment normal.      Results for orders placed or performed in visit on 12/05/22  POCT CBG (Fasting - Glucose)  Result Value Ref Range   Glucose Fasting, POC 73 70 - 99 mg/dL  POC CREATINE & ALBUMIN,URINE  Result Value Ref Range   Microalbumin Ur, POC 30 mg/L   Creatinine, POC 300 mg/dL   Albumin/Creatinine Ratio, Urine, POC <30     Recent Results (from the past 2160 hour(s))  POCT CBG (Fasting - Glucose)     Status: None   Collection Time: 12/05/22 11:54 AM  Result Value Ref Range   Glucose Fasting, POC 73 70 - 99 mg/dL  POC CREATINE & ALBUMIN,URINE     Status: Normal   Collection  Time: 12/05/22 12:12 PM  Result Value Ref Range   Microalbumin Ur, POC 30 mg/L   Creatinine, POC 300 mg/dL   Albumin/Creatinine Ratio, Urine, POC <30        Assessment & Plan:   Problem List Items Addressed This Visit       Active Problems   HLD (hyperlipidemia)   Relevant Orders   Lipid panel   CMP14+EGFR   CBC with Diff   Class 2 severe obesity due to excess calories with serious comorbidity and body mass index (BMI) of 35.0 to 35.9 in adult [facility])    Continue current meds.  Will adjust as needed based on results.  The patient is asked to make an attempt to improve diet and exercise patterns to aid in medical management of this problem. Addressed importance of increasing and maintaining water intake.        Type 2 diabetes mellitus with diabetic neuropathy, without long-term current use of insulin (HCC) - Primary    Checking labs today. Will call pt. With results  Continue current diabetes POC, as patient has been well controlled on current regimen.  Will adjust meds if needed based on labs.        Relevant Orders   POCT CBG (Fasting - Glucose) (Completed)   Lipid panel   VITAMIN D 25 Hydroxy (Vit-D Deficiency, Fractures)   CMP14+EGFR   TSH   Hemoglobin A1c   Vitamin B12   CBC with Diff   EKG 12-Lead   POC CREATINE & ALBUMIN,URINE (Completed)   Psoriatic arthritis (HCC)    Patient is seen by Rheumatology, who manage this condition.  He is well controlled with current therapy.   Will defer to them for further changes to plan of care.       Ankylosing spondylitis (HCC)    Patient stable.  Well controlled with current therapy.   Continue current meds.       Essential hypertension, benign    Blood pressure well controlled with current medications.  Continue current therapy.  Will reassess at follow up.       Relevant Orders   CMP14+EGFR   CBC with Diff   Other Visit Diagnoses     B12 deficiency due to diet       Checking labs today.  Will  continue supplements as needed.   Relevant Orders   CMP14+EGFR   Vitamin B12   CBC with Diff   Vitamin D deficiency, unspecified       Checking labs today.  Will continue supplements as needed.   Relevant Orders   VITAMIN D 25 Hydroxy (Vit-D Deficiency, Fractures)   CMP14+EGFR   CBC with Diff   Other fatigue       Relevant Orders   CMP14+EGFR   TSH   CBC with Diff   Need for hepatitis C screening test       Test ordered in office today. Will call with results.   Relevant Orders   CMP14+EGFR   CBC with Diff   Palpitations       EKG in office today.  Setting up referral for cardiology.   Relevant Orders   EKG 12-Lead       Return in about 3 months (around 03/07/2023) for F/U.   Total time spent: 30 minutes  Miki Kins, FNP  12/05/2022   This document may have been prepared by [facility] Voice Recognition software and as such may include unintentional dictation errors.

## 2022-12-05 NOTE — Assessment & Plan Note (Signed)
Patient stable.  Well controlled with current therapy.   Continue current meds.  

## 2022-12-05 NOTE — Assessment & Plan Note (Signed)
Patient is seen by Rheumatology, who manage this condition.  He is well controlled with current therapy.   Will defer to them for further changes to plan of care.

## 2022-12-05 NOTE — Assessment & Plan Note (Addendum)
Continue current meds.  Will adjust as needed based on results.  The patient is asked to make an attempt to improve diet and exercise patterns to aid in medical management of this problem. Addressed importance of increasing and maintaining water intake.   

## 2022-12-05 NOTE — Assessment & Plan Note (Signed)
Blood pressure well controlled with current medications.  Continue current therapy.  Will reassess at follow up.  

## 2022-12-06 LAB — CMP14+EGFR
ALT: 33 [IU]/L (ref 0–44)
AST: 45 [IU]/L — ABNORMAL HIGH (ref 0–40)
Albumin: 4.5 g/dL (ref 3.9–4.9)
Alkaline Phosphatase: 72 [IU]/L (ref 44–121)
BUN/Creatinine Ratio: 23 (ref 10–24)
BUN: 26 mg/dL (ref 8–27)
Bilirubin Total: 0.2 mg/dL (ref 0.0–1.2)
CO2: 25 mmol/L (ref 20–29)
Calcium: 12 mg/dL — ABNORMAL HIGH (ref 8.6–10.2)
Chloride: 100 mmol/L (ref 96–106)
Creatinine, Ser: 1.14 mg/dL (ref 0.76–1.27)
Globulin, Total: 3.2 g/dL (ref 1.5–4.5)
Glucose: 78 mg/dL (ref 70–99)
Potassium: 5.2 mmol/L (ref 3.5–5.2)
Sodium: 139 mmol/L (ref 134–144)
Total Protein: 7.7 g/dL (ref 6.0–8.5)
eGFR: 70 mL/min/{1.73_m2} (ref >59)

## 2022-12-06 LAB — CBC WITH DIFFERENTIAL/PLATELET
Basophils Absolute: 0 10*3/uL (ref 0.0–0.2)
Basos: 1 %
EOS (ABSOLUTE): 0.2 10*3/uL (ref 0.0–0.4)
Eos: 7 %
Hematocrit: 33.8 % — ABNORMAL LOW (ref 37.5–51.0)
Hemoglobin: 10.6 g/dL — ABNORMAL LOW (ref 13.0–17.7)
Immature Grans (Abs): 0 10*3/uL (ref 0.0–0.1)
Immature Granulocytes: 0 %
Lymphocytes Absolute: 1.1 10*3/uL (ref 0.7–3.1)
Lymphs: 33 %
MCH: 28.4 pg (ref 26.6–33.0)
MCHC: 31.4 g/dL — ABNORMAL LOW (ref 31.5–35.7)
MCV: 91 fL (ref 79–97)
Monocytes Absolute: 0.4 10*3/uL (ref 0.1–0.9)
Monocytes: 10 %
Neutrophils Absolute: 1.6 10*3/uL (ref 1.4–7.0)
Neutrophils: 49 %
Platelets: 206 10*3/uL (ref 150–450)
RBC: 3.73 x10E6/uL — ABNORMAL LOW (ref 4.14–5.80)
RDW: 15.8 % — ABNORMAL HIGH (ref 11.6–15.4)
WBC: 3.4 10*3/uL (ref 3.4–10.8)

## 2022-12-06 LAB — VITAMIN D 25 HYDROXY (VIT D DEFICIENCY, FRACTURES): Vit D, 25-Hydroxy: 45.4 ng/mL (ref 30.0–100.0)

## 2022-12-06 LAB — LIPID PANEL
Chol/HDL Ratio: 2.5 ratio (ref 0.0–5.0)
Cholesterol, Total: 124 mg/dL (ref 100–199)
HDL: 49 mg/dL (ref >39)
LDL Chol Calc (NIH): 52 mg/dL (ref 0–99)
Triglycerides: 132 mg/dL (ref 0–149)
VLDL Cholesterol Cal: 23 mg/dL (ref 5–40)

## 2022-12-06 LAB — VITAMIN B12: Vitamin B-12: 361 pg/mL (ref 232–1245)

## 2022-12-06 LAB — HEMOGLOBIN A1C
Est. average glucose Bld gHb Est-mCnc: 157 mg/dL
Hgb A1c MFr Bld: 7.1 % — ABNORMAL HIGH (ref 4.8–5.6)

## 2022-12-06 LAB — TSH: TSH: 3.29 u[IU]/mL (ref 0.450–4.500)

## 2022-12-10 LAB — IRON AND TIBC
Iron Saturation: 12 % — ABNORMAL LOW (ref 15–55)
Iron: 55 ug/dL (ref 38–169)
Total Iron Binding Capacity: 476 ug/dL — ABNORMAL HIGH (ref 250–450)
UIBC: 421 ug/dL — ABNORMAL HIGH (ref 111–343)

## 2022-12-10 LAB — SPECIMEN STATUS REPORT

## 2022-12-10 LAB — FERRITIN: Ferritin: 19 ng/mL — ABNORMAL LOW (ref 30–400)

## 2022-12-19 ENCOUNTER — Ambulatory Visit: Payer: 59 | Admitting: Cardiovascular Disease

## 2022-12-19 ENCOUNTER — Encounter: Payer: Self-pay | Admitting: Cardiovascular Disease

## 2022-12-19 VITALS — BP 132/83 | HR 67 | Ht 73.0 in | Wt 268.0 lb

## 2022-12-19 DIAGNOSIS — Z01810 Encounter for preprocedural cardiovascular examination: Secondary | ICD-10-CM

## 2022-12-19 DIAGNOSIS — E782 Mixed hyperlipidemia: Secondary | ICD-10-CM | POA: Diagnosis not present

## 2022-12-19 DIAGNOSIS — I10 Essential (primary) hypertension: Secondary | ICD-10-CM | POA: Diagnosis not present

## 2022-12-19 DIAGNOSIS — R002 Palpitations: Secondary | ICD-10-CM | POA: Diagnosis not present

## 2022-12-19 DIAGNOSIS — E1140 Type 2 diabetes mellitus with diabetic neuropathy, unspecified: Secondary | ICD-10-CM | POA: Diagnosis not present

## 2022-12-19 NOTE — Progress Notes (Signed)
Cardiology Office Note   Date:  12/19/2022   ID:  Raymond Michael, DOB 10/27/1955, MRN [id number]  PCP:  Miki Kins, FNP  Cardiologist:  Adrian Blackwater, MD      History of Present Illness: Raymond Michael is a 67 y.o. male who presents for No chief complaint on file.   Needs hip surgery      Past Medical History:  Diagnosis Date   Arthritis    Cancer (HCC)    Diabetes (HCC)    GERD (gastroesophageal reflux disease)    Hyperlipidemia      Past Surgical History:  Procedure Laterality Date   COLONOSCOPY     COLONOSCOPY WITH PROPOFOL N/A 08/13/2021   Procedure: COLONOSCOPY WITH PROPOFOL;  Surgeon: Wyline Mood, MD;  Location: [facility] ENDOSCOPY;  Service: Gastroenterology;  Laterality: N/A;   NOSE SURGERY     RADIOACTIVE SEED IMPLANT N/A 02/22/2021   Procedure: RADIOACTIVE SEED IMPLANT/BRACHYTHERAPY IMPLANT;  Surgeon: Vanna Scotland, MD;  Location: [facility] ORS;  Service: Urology;  Laterality: N/A;   TONSILLECTOMY AND ADENOIDECTOMY       Current Outpatient Medications  Medication Sig Dispense Refill   Calcium Carb-Cholecalciferol (CALCIUM + D3 PO) Take 2 tablets by mouth in the morning.     diclofenac (CATAFLAM) 50 MG tablet TAKE 1 TABLET BY MOUTH THREE TIMES A DAY AS NEEDED 90 tablet 6   diclofenac (VOLTAREN) 50 MG EC tablet Take 50 mg by mouth See admin instructions. Take 2 tablets (100 mg) by mouth in the morning & take 1 tablet (50 mg) by mouth at night.     gabapentin (NEURONTIN) 100 MG capsule Take 1 capsule by mouth twice daily 180 capsule 2   glipiZIDE (GLUCOTROL) 5 MG tablet TAKE 1 TABLET BY MOUTH EVERY DAY 90 tablet 1   ibuprofen (ADVIL) 200 MG tablet Take 400 mg by mouth every 8 (eight) hours as needed (for pain.).     metFORMIN (GLUCOPHAGE) 1000 MG tablet TAKE 1 TABLET BY MOUTH TWICE A DAY 180 tablet 3   mirabegron ER (MYRBETRIQ) 25 MG TB24 tablet Take 1 tablet (25 mg total) by mouth daily. 30 tablet 0   oxybutynin (DITROPAN-XL) 10 MG 24 hr tablet TAKE 1 TABLET  BY MOUTH EVERY DAY 90 tablet 3   pioglitazone (ACTOS) 15 MG tablet TAKE 1 TABLET BY MOUTH EVERY DAY 90 tablet 1   rosuvastatin (CRESTOR) 10 MG tablet TAKE 1 TABLET BY MOUTH EVERY DAY 90 tablet 3   Semaglutide,0.25 or 0.5MG /DOS, (OZEMPIC, 0.25 OR 0.5 MG/DOSE,) 2 MG/3ML SOPN INJECT 0.5 MG INTO THE SKIN ONE TIME PER WEEK 3 mL 1   sildenafil (VIAGRA) 100 MG tablet Take 1 tablet (100 mg total) by mouth daily as needed for erectile dysfunction. Take one tab one hour prior to intercourse 30 tablet 3   tamsulosin (FLOMAX) 0.4 MG CAPS capsule TAKE 1 CAPSULE BY MOUTH EVERY DAY 90 capsule 3   traMADol (ULTRAM) 50 MG tablet Take 50 mg by mouth 2 (two) times daily as needed.     No current facility-administered medications for this visit.    Allergies:   Patient has no known allergies.    Social History:   reports that he has never smoked. He has never been exposed to tobacco smoke. He has never used smokeless tobacco. He reports current alcohol use. He reports that he does not use drugs.   Family History:  family history includes Diabetes in his brother and father.    ROS:  Review of Systems  Constitutional: Negative.   HENT: Negative.    Eyes: Negative.   Respiratory: Negative.    Gastrointestinal: Negative.   Genitourinary: Negative.   Musculoskeletal: Negative.   Skin: Negative.   Neurological: Negative.   Endo/Heme/Allergies: Negative.   Psychiatric/Behavioral: Negative.    All other systems reviewed and are negative.     All other systems are reviewed and negative.    PHYSICAL EXAM: VS:  BP 132/83   Pulse 67   Ht 6\' 1"  (1.854 m)   Wt 268 lb (121.6 kg)   SpO2 98%   BMI 35.36 kg/m  , BMI Body mass index is 35.36 kg/m. Last weight:  Wt Readings from Last 3 Encounters:  12/19/22 268 lb (121.6 kg)  12/05/22 287 lb (130.2 kg)  06/21/22 285 lb (129.3 kg)     Physical Exam Vitals reviewed.  Constitutional:      Appearance: Normal appearance. He is normal weight.  HENT:      Head: Normocephalic.     Nose: Nose normal.     Mouth/Throat:     Mouth: Mucous membranes are moist.  Eyes:     Pupils: Pupils are equal, round, and reactive to light.  Cardiovascular:     Rate and Rhythm: Normal rate and regular rhythm.     Pulses: Normal pulses.     Heart sounds: Normal heart sounds.  Pulmonary:     Effort: Pulmonary effort is normal.  Abdominal:     General: Abdomen is flat. Bowel sounds are normal.  Musculoskeletal:        General: Normal range of motion.     Cervical back: Normal range of motion.  Skin:    General: Skin is warm.  Neurological:     General: No focal deficit present.     Mental Status: He is alert.  Psychiatric:        Mood and Affect: Mood normal.       EKG:   Recent Labs: 12/05/2022: ALT 33; BUN 26; Creatinine, Ser 1.14; Hemoglobin 10.6; Platelets 206; Potassium 5.2; Sodium 139; TSH 3.290    Lipid Panel    Component Value Date/Time   CHOL 124 12/05/2022 1309   TRIG 132 12/05/2022 1309   HDL 49 12/05/2022 1309   CHOLHDL 2.5 12/05/2022 1309   LDLCALC 52 12/05/2022 1309      Other studies Reviewed: Additional studies/ records that were reviewed today include:  Review of the above records demonstrates:       No data to display            ASSESSMENT AND PLAN:    ICD-10-CM   1. Type 2 diabetes mellitus with diabetic neuropathy, without long-term current use of insulin (HCC)  E11.40 PCV ECHOCARDIOGRAM COMPLETE    MYOCARDIAL PERFUSION IMAGING    2. Essential hypertension, benign  I10 PCV ECHOCARDIOGRAM COMPLETE    MYOCARDIAL PERFUSION IMAGING    3. Mixed hyperlipidemia  E78.2 PCV ECHOCARDIOGRAM COMPLETE    MYOCARDIAL PERFUSION IMAGING    4. Palpitations  R00.2 PCV ECHOCARDIOGRAM COMPLETE    MYOCARDIAL PERFUSION IMAGING    5. Pre-operative cardiovascular examination  Z01.810 PCV ECHOCARDIOGRAM COMPLETE   advise echo, stress test       Problem List Items Addressed This Visit       Cardiovascular and  Mediastinum   Essential hypertension, benign   Relevant Orders   PCV ECHOCARDIOGRAM COMPLETE   MYOCARDIAL PERFUSION IMAGING     Endocrine   Type 2 diabetes mellitus  with diabetic neuropathy, without long-term current use of insulin (HCC) - Primary   Relevant Orders   PCV ECHOCARDIOGRAM COMPLETE   MYOCARDIAL PERFUSION IMAGING     Other   HLD (hyperlipidemia)   Relevant Orders   PCV ECHOCARDIOGRAM COMPLETE   MYOCARDIAL PERFUSION IMAGING   Other Visit Diagnoses     Palpitations       Relevant Orders   PCV ECHOCARDIOGRAM COMPLETE   MYOCARDIAL PERFUSION IMAGING   Pre-operative cardiovascular examination       advise echo, stress test   Relevant Orders   PCV ECHOCARDIOGRAM COMPLETE          Disposition:   Return in about 2 weeks (around 01/02/2023) for echo, stress test and f/u.    Total time spent: 30 minutes  Signed,  Adrian Blackwater, MD  12/19/2022 11:00 AM    [facility]

## 2022-12-24 ENCOUNTER — Other Ambulatory Visit: Payer: Self-pay | Admitting: Family

## 2022-12-26 ENCOUNTER — Ambulatory Visit: Payer: 59 | Admitting: Family

## 2022-12-28 ENCOUNTER — Other Ambulatory Visit: Payer: Self-pay

## 2022-12-28 DIAGNOSIS — C61 Malignant neoplasm of prostate: Secondary | ICD-10-CM

## 2022-12-30 ENCOUNTER — Other Ambulatory Visit: Payer: 59

## 2022-12-30 ENCOUNTER — Ambulatory Visit (INDEPENDENT_AMBULATORY_CARE_PROVIDER_SITE_OTHER): Payer: 59

## 2022-12-30 DIAGNOSIS — R002 Palpitations: Secondary | ICD-10-CM

## 2022-12-30 DIAGNOSIS — E782 Mixed hyperlipidemia: Secondary | ICD-10-CM

## 2022-12-30 DIAGNOSIS — E1140 Type 2 diabetes mellitus with diabetic neuropathy, unspecified: Secondary | ICD-10-CM | POA: Diagnosis not present

## 2022-12-30 DIAGNOSIS — I10 Essential (primary) hypertension: Secondary | ICD-10-CM

## 2022-12-30 MED ORDER — TECHNETIUM TC 99M SESTAMIBI GENERIC - CARDIOLITE
9.8000 | Freq: Once | INTRAVENOUS | Status: AC | PRN
  Administered 2022-12-30: 9.8 via INTRAVENOUS

## 2022-12-30 MED ORDER — TECHNETIUM TC 99M SESTAMIBI GENERIC - CARDIOLITE
34.2000 | Freq: Once | INTRAVENOUS | Status: AC | PRN
  Administered 2022-12-30: 34.2 via INTRAVENOUS

## 2022-12-31 ENCOUNTER — Other Ambulatory Visit: Payer: Self-pay | Admitting: Family

## 2023-01-02 ENCOUNTER — Other Ambulatory Visit: Payer: 59

## 2023-01-02 DIAGNOSIS — C61 Malignant neoplasm of prostate: Secondary | ICD-10-CM

## 2023-01-03 ENCOUNTER — Ambulatory Visit: Payer: 59 | Admitting: Urology

## 2023-01-03 LAB — PSA: Prostate Specific Ag, Serum: <0.1 ng/mL (ref 0.0–4.0)

## 2023-01-09 ENCOUNTER — Ambulatory Visit: Payer: 59 | Admitting: Cardiovascular Disease

## 2023-01-11 ENCOUNTER — Ambulatory Visit (INDEPENDENT_AMBULATORY_CARE_PROVIDER_SITE_OTHER): Payer: 59

## 2023-01-11 DIAGNOSIS — Z01810 Encounter for preprocedural cardiovascular examination: Secondary | ICD-10-CM

## 2023-01-11 DIAGNOSIS — I351 Nonrheumatic aortic (valve) insufficiency: Secondary | ICD-10-CM | POA: Diagnosis not present

## 2023-01-11 DIAGNOSIS — I10 Essential (primary) hypertension: Secondary | ICD-10-CM

## 2023-01-11 DIAGNOSIS — R002 Palpitations: Secondary | ICD-10-CM

## 2023-01-11 DIAGNOSIS — E782 Mixed hyperlipidemia: Secondary | ICD-10-CM

## 2023-01-11 DIAGNOSIS — E1140 Type 2 diabetes mellitus with diabetic neuropathy, unspecified: Secondary | ICD-10-CM

## 2023-01-16 ENCOUNTER — Encounter: Payer: Self-pay | Admitting: Cardiovascular Disease

## 2023-01-16 ENCOUNTER — Ambulatory Visit (INDEPENDENT_AMBULATORY_CARE_PROVIDER_SITE_OTHER): Payer: 59 | Admitting: Cardiovascular Disease

## 2023-01-16 VITALS — BP 132/82 | Ht 73.0 in | Wt 284.0 lb

## 2023-01-16 DIAGNOSIS — I10 Essential (primary) hypertension: Secondary | ICD-10-CM | POA: Diagnosis not present

## 2023-01-16 DIAGNOSIS — Z01810 Encounter for preprocedural cardiovascular examination: Secondary | ICD-10-CM

## 2023-01-16 DIAGNOSIS — I259 Chronic ischemic heart disease, unspecified: Secondary | ICD-10-CM

## 2023-01-16 DIAGNOSIS — E782 Mixed hyperlipidemia: Secondary | ICD-10-CM | POA: Diagnosis not present

## 2023-01-16 DIAGNOSIS — R002 Palpitations: Secondary | ICD-10-CM

## 2023-01-16 DIAGNOSIS — E1140 Type 2 diabetes mellitus with diabetic neuropathy, unspecified: Secondary | ICD-10-CM

## 2023-01-16 MED ORDER — METOPROLOL TARTRATE 50 MG PO TABS: ORAL_TABLET | ORAL | 0 refills | Status: AC

## 2023-01-16 NOTE — Progress Notes (Signed)
Cardiology Office Note   Date:  01/16/2023   ID:  Raymond Michael, DOB 1956-01-14, MRN [id number]  PCP:  Miki Kins, FNP  Cardiologist:  Adrian Blackwater, MD      History of Present Illness: Raymond Michael is a 67 y.o. male who presents for  Chief Complaint  Patient presents with   Follow-up    Echo and NST results    Doing well      Past Medical History:  Diagnosis Date   Arthritis    Cancer (HCC)    Diabetes (HCC)    GERD (gastroesophageal reflux disease)    Hyperlipidemia      Past Surgical History:  Procedure Laterality Date   COLONOSCOPY     COLONOSCOPY WITH PROPOFOL N/A 08/13/2021   Procedure: COLONOSCOPY WITH PROPOFOL;  Surgeon: Wyline Mood, MD;  Location: [facility] ENDOSCOPY;  Service: Gastroenterology;  Laterality: N/A;   NOSE SURGERY     RADIOACTIVE SEED IMPLANT N/A 02/22/2021   Procedure: RADIOACTIVE SEED IMPLANT/BRACHYTHERAPY IMPLANT;  Surgeon: Vanna Scotland, MD;  Location: [facility] ORS;  Service: Urology;  Laterality: N/A;   TONSILLECTOMY AND ADENOIDECTOMY       Current Outpatient Medications  Medication Sig Dispense Refill   metoprolol tartrate (LOPRESSOR) 50 MG tablet Take 1 tab night before and 1 tab 90 minutes prior to CCTA 2 tablet 0   Calcium Carb-Cholecalciferol (CALCIUM + D3 PO) Take 2 tablets by mouth in the morning.     diclofenac (CATAFLAM) 50 MG tablet TAKE 1 TABLET BY MOUTH THREE TIMES A DAY AS NEEDED 90 tablet 6   diclofenac (VOLTAREN) 50 MG EC tablet Take 50 mg by mouth See admin instructions. Take 2 tablets (100 mg) by mouth in the morning & take 1 tablet (50 mg) by mouth at night.     gabapentin (NEURONTIN) 100 MG capsule Take 1 capsule by mouth twice daily 180 capsule 2   glipiZIDE (GLUCOTROL) 5 MG tablet TAKE 1 TABLET BY MOUTH EVERY DAY 90 tablet 1   ibuprofen (ADVIL) 200 MG tablet Take 400 mg by mouth every 8 (eight) hours as needed (for pain.).     metFORMIN (GLUCOPHAGE) 1000 MG tablet TAKE 1 TABLET BY MOUTH TWICE A DAY 180 tablet 3    mirabegron ER (MYRBETRIQ) 25 MG TB24 tablet Take 1 tablet (25 mg total) by mouth daily. 30 tablet 0   oxybutynin (DITROPAN-XL) 10 MG 24 hr tablet TAKE 1 TABLET BY MOUTH EVERY DAY 90 tablet 3   pioglitazone (ACTOS) 15 MG tablet TAKE 1 TABLET BY MOUTH EVERY DAY 90 tablet 1   rosuvastatin (CRESTOR) 10 MG tablet TAKE 1 TABLET BY MOUTH EVERY DAY 90 tablet 3   Semaglutide,0.25 or 0.5MG /DOS, (OZEMPIC, 0.25 OR 0.5 MG/DOSE,) 2 MG/3ML SOPN INJECT 0.5 MG INTO THE SKIN ONE TIME PER WEEK 2 mL 1   sildenafil (VIAGRA) 100 MG tablet Take 1 tablet (100 mg total) by mouth daily as needed for erectile dysfunction. Take one tab one hour prior to intercourse 30 tablet 3   tamsulosin (FLOMAX) 0.4 MG CAPS capsule TAKE 1 CAPSULE BY MOUTH EVERY DAY 90 capsule 3   traMADol (ULTRAM) 50 MG tablet Take 50 mg by mouth 2 (two) times daily as needed.     No current facility-administered medications for this visit.    Allergies:   Patient has no known allergies.    Social History:   reports that he has never smoked. He has never been exposed to tobacco smoke. He has never  used smokeless tobacco. He reports current alcohol use. He reports that he does not use drugs.   Family History:  family history includes Diabetes in his brother and father.    ROS:     Review of Systems  Constitutional: Negative.   HENT: Negative.    Eyes: Negative.   Respiratory: Negative.    Gastrointestinal: Negative.   Genitourinary: Negative.   Musculoskeletal: Negative.   Skin: Negative.   Neurological: Negative.   Endo/Heme/Allergies: Negative.   Psychiatric/Behavioral: Negative.    All other systems reviewed and are negative.     All other systems are reviewed and negative.    PHYSICAL EXAM: VS:  BP 132/82   Ht 6\' 1"  (1.854 m)   Wt 284 lb (128.8 kg)   BMI 37.47 kg/m  , BMI Body mass index is 37.47 kg/m. Last weight:  Wt Readings from Last 3 Encounters:  01/16/23 284 lb (128.8 kg)  12/19/22 268 lb (121.6 kg)  12/05/22  287 lb (130.2 kg)     Physical Exam Vitals reviewed.  Constitutional:      Appearance: Normal appearance. He is normal weight.  HENT:     Head: Normocephalic.     Nose: Nose normal.     Mouth/Throat:     Mouth: Mucous membranes are moist.  Eyes:     Pupils: Pupils are equal, round, and reactive to light.  Cardiovascular:     Rate and Rhythm: Normal rate and regular rhythm.     Pulses: Normal pulses.     Heart sounds: Normal heart sounds.  Pulmonary:     Effort: Pulmonary effort is normal.  Abdominal:     General: Abdomen is flat. Bowel sounds are normal.  Musculoskeletal:        General: Normal range of motion.     Cervical back: Normal range of motion.  Skin:    General: Skin is warm.  Neurological:     General: No focal deficit present.     Mental Status: He is alert.  Psychiatric:        Mood and Affect: Mood normal.       EKG:   Recent Labs: 12/05/2022: ALT 33; BUN 26; Creatinine, Ser 1.14; Hemoglobin 10.6; Platelets 206; Potassium 5.2; Sodium 139; TSH 3.290    Lipid Panel    Component Value Date/Time   CHOL 124 12/05/2022 1309   TRIG 132 12/05/2022 1309   HDL 49 12/05/2022 1309   CHOLHDL 2.5 12/05/2022 1309   LDLCALC 52 12/05/2022 1309      Other studies Reviewed: Additional studies/ records that were reviewed today include:  Review of the above records demonstrates:       No data to display            ASSESSMENT AND PLAN:    ICD-10-CM   1. Pre-operative cardiovascular examination  Z01.810 CT CORONARY MORPH W/CTA COR W/SCORE W/CA W/CM &/OR WO/CM    Basic metabolic panel    metoprolol tartrate (LOPRESSOR) 50 MG tablet   Stress test suggest ischaemia RCA, advise CCTA    2. Type 2 diabetes mellitus with diabetic neuropathy, without long-term current use of insulin (HCC)  E11.40 CT CORONARY MORPH W/CTA COR W/SCORE W/CA W/CM &/OR WO/CM    Basic metabolic panel    metoprolol tartrate (LOPRESSOR) 50 MG tablet    3. Essential hypertension,  benign  I10 CT CORONARY MORPH W/CTA COR W/SCORE W/CA W/CM &/OR WO/CM    Basic metabolic panel    metoprolol tartrate (LOPRESSOR)  50 MG tablet    4. Mixed hyperlipidemia  E78.2 CT CORONARY MORPH W/CTA COR W/SCORE W/CA W/CM &/OR WO/CM    Basic metabolic panel    metoprolol tartrate (LOPRESSOR) 50 MG tablet    5. Palpitations  R00.2 CT CORONARY MORPH W/CTA COR W/SCORE W/CA W/CM &/OR WO/CM    Basic metabolic panel    metoprolol tartrate (LOPRESSOR) 50 MG tablet    6. Chest pain due to myocardial ischemia, unspecified ischemic chest pain type  I25.9 CT CORONARY MORPH W/CTA COR W/SCORE W/CA W/CM &/OR WO/CM    Basic metabolic panel    metoprolol tartrate (LOPRESSOR) 50 MG tablet       Problem List Items Addressed This Visit       Cardiovascular and Mediastinum   Essential hypertension, benign   Relevant Medications   metoprolol tartrate (LOPRESSOR) 50 MG tablet   Other Relevant Orders   CT CORONARY MORPH W/CTA COR W/SCORE W/CA W/CM &/OR WO/CM   Basic metabolic panel     Endocrine   Type 2 diabetes mellitus with diabetic neuropathy, without long-term current use of insulin (HCC)   Relevant Medications   metoprolol tartrate (LOPRESSOR) 50 MG tablet   Other Relevant Orders   CT CORONARY MORPH W/CTA COR W/SCORE W/CA W/CM &/OR WO/CM   Basic metabolic panel     Other   HLD (hyperlipidemia)   Relevant Medications   metoprolol tartrate (LOPRESSOR) 50 MG tablet   Other Relevant Orders   CT CORONARY MORPH W/CTA COR W/SCORE W/CA W/CM &/OR WO/CM   Basic metabolic panel   Other Visit Diagnoses     Pre-operative cardiovascular examination    -  Primary   Stress test suggest ischaemia RCA, advise CCTA   Relevant Medications   metoprolol tartrate (LOPRESSOR) 50 MG tablet   Other Relevant Orders   CT CORONARY MORPH W/CTA COR W/SCORE W/CA W/CM &/OR WO/CM   Basic metabolic panel   Palpitations       Relevant Medications   metoprolol tartrate (LOPRESSOR) 50 MG tablet   Other Relevant  Orders   CT CORONARY MORPH W/CTA COR W/SCORE W/CA W/CM &/OR WO/CM   Basic metabolic panel   Chest pain due to myocardial ischemia, unspecified ischemic chest pain type       Relevant Medications   metoprolol tartrate (LOPRESSOR) 50 MG tablet   Other Relevant Orders   CT CORONARY MORPH W/CTA COR W/SCORE W/CA W/CM &/OR WO/CM   Basic metabolic panel          Disposition:   No follow-ups on file.    Total time spent: 30 minutes  Signed,  Adrian Blackwater, MD  01/16/2023 10:05 AM    [facility]

## 2023-01-17 ENCOUNTER — Ambulatory Visit: Payer: 59 | Admitting: Cardiovascular Disease

## 2023-01-19 ENCOUNTER — Other Ambulatory Visit: Payer: 59

## 2023-01-20 LAB — BASIC METABOLIC PANEL
BUN/Creatinine Ratio: 12 (ref 10–24)
BUN: 17 mg/dL (ref 8–27)
CO2: 24 mmol/L (ref 20–29)
Calcium: 12 mg/dL — ABNORMAL HIGH (ref 8.6–10.2)
Chloride: 101 mmol/L (ref 96–106)
Creatinine, Ser: 1.39 mg/dL — ABNORMAL HIGH (ref 0.76–1.27)
Glucose: 159 mg/dL — ABNORMAL HIGH (ref 70–99)
Potassium: 5 mmol/L (ref 3.5–5.2)
Sodium: 140 mmol/L (ref 134–144)
eGFR: 56 mL/min/{1.73_m2} — ABNORMAL LOW (ref >59)

## 2023-01-24 ENCOUNTER — Ambulatory Visit: Payer: 59

## 2023-01-24 DIAGNOSIS — E1140 Type 2 diabetes mellitus with diabetic neuropathy, unspecified: Secondary | ICD-10-CM

## 2023-01-24 DIAGNOSIS — Z01810 Encounter for preprocedural cardiovascular examination: Secondary | ICD-10-CM | POA: Diagnosis not present

## 2023-01-24 DIAGNOSIS — I259 Chronic ischemic heart disease, unspecified: Secondary | ICD-10-CM | POA: Diagnosis not present

## 2023-01-24 DIAGNOSIS — R002 Palpitations: Secondary | ICD-10-CM

## 2023-01-24 DIAGNOSIS — E782 Mixed hyperlipidemia: Secondary | ICD-10-CM

## 2023-01-24 DIAGNOSIS — I10 Essential (primary) hypertension: Secondary | ICD-10-CM

## 2023-01-24 MED ORDER — IOHEXOL 350 MG/ML SOLN
100.0000 mL | Freq: Once | INTRAVENOUS | Status: AC | PRN
  Administered 2023-01-24: 100 mL via INTRAVENOUS

## 2023-01-26 ENCOUNTER — Other Ambulatory Visit: Payer: Self-pay | Admitting: *Deleted

## 2023-01-26 DIAGNOSIS — C61 Malignant neoplasm of prostate: Secondary | ICD-10-CM

## 2023-01-29 ENCOUNTER — Other Ambulatory Visit: Payer: Self-pay | Admitting: Family

## 2023-01-29 DIAGNOSIS — E7800 Pure hypercholesterolemia, unspecified: Secondary | ICD-10-CM

## 2023-01-31 ENCOUNTER — Ambulatory Visit (INDEPENDENT_AMBULATORY_CARE_PROVIDER_SITE_OTHER): Payer: 59 | Admitting: Urology

## 2023-01-31 VITALS — BP 109/66 | HR 99

## 2023-01-31 DIAGNOSIS — C61 Malignant neoplasm of prostate: Secondary | ICD-10-CM | POA: Diagnosis not present

## 2023-01-31 DIAGNOSIS — R350 Frequency of micturition: Secondary | ICD-10-CM | POA: Diagnosis not present

## 2023-01-31 DIAGNOSIS — N3941 Urge incontinence: Secondary | ICD-10-CM

## 2023-01-31 LAB — BLADDER SCAN AMB NON-IMAGING: Scan Result: 13

## 2023-01-31 MED ORDER — LEUPROLIDE ACETATE (6 MONTH) 45 MG ~~LOC~~ KIT
45.0000 mg | PACK | Freq: Once | SUBCUTANEOUS | Status: AC
  Administered 2023-01-31: 45 mg via SUBCUTANEOUS

## 2023-01-31 NOTE — Progress Notes (Incomplete)
I,Amy L Pierron,acting as a scribe for Vanna Scotland, MD.,have documented all relevant documentation on the behalf of Vanna Scotland, MD,as directed by  Vanna Scotland, MD while in the presence of Vanna Scotland, MD.  01/31/2023 4:08 PM   Raymond Michael 1955/02/19 [id number]  Referring provider: Miki Kins, FNP [address] [place],  [place] [address]  Chief Complaint  Patient presents with   Follow-up    HPI: 67 year-old male with a personal history of high risk prostate cancer returns today for a follow-up.  He initially presented with a PSA of 93.5. Biopsy showed Gleason 4+3, 3+4 and 3+3.  Large volume up to 86%. He underwent staging in the form of PSMA PET scan that was negative on 11/2020. He was started on ADT and underwent seed placement therapy on 02/2021.  He is currently on ADT and his PSA has remained undetectable since initiation of this.   He also has a personal history of urgency frequency and is managed on [place].   He mentions not having an erection since he started with the treatments.   He is not thrilled about getting another depo shot, however understands the benefit and is willing to proceed with it today.  He has some urgency and nocturia x3. He doesn't think he snores or has sleep apnea, but he doesn't have anyone to hear if he does.   He doesn't think he has been taking Oxybutynin, Myrbetriq, or Flomax.   Results for orders placed or performed in visit on 01/31/23  Bladder Scan (Post Void Residual) in office  Result Value Ref Range   Scan Result 13 ml     IPSS     Row Name 01/31/23 1500         International Prostate Symptom Score   How often have you had the sensation of not emptying your bladder? More than half the time     How often have you had to urinate less than every two hours? Almost always     How often have you found you stopped and started again several times when you urinated? Not at All     How often have you found it  difficult to postpone urination? More than half the time     How often have you had a weak urinary stream? About half the time     How often have you had to strain to start urination? Not at All     How many times did you typically get up at night to urinate? 3 Times     Total IPSS Score 19       Quality of Life due to urinary symptoms   If you were to spend the rest of your life with your urinary condition just the way it is now how would you feel about that? Mostly Satisfied            Score:  1-7 Mild 8-19 Moderate 20-35 Severe   PMH: Past Medical History:  Diagnosis Date   Arthritis    Cancer (HCC)    Diabetes (HCC)    GERD (gastroesophageal reflux disease)    Hyperlipidemia     Surgical History: Past Surgical History:  Procedure Laterality Date   COLONOSCOPY     COLONOSCOPY WITH PROPOFOL N/A 08/13/2021   Procedure: COLONOSCOPY WITH PROPOFOL;  Surgeon: Wyline Mood, MD;  Location: [facility] ENDOSCOPY;  Service: Gastroenterology;  Laterality: N/A;   NOSE SURGERY     RADIOACTIVE SEED IMPLANT N/A 02/22/2021  Procedure: RADIOACTIVE SEED IMPLANT/BRACHYTHERAPY IMPLANT;  Surgeon: Vanna Scotland, MD;  Location: [facility] ORS;  Service: Urology;  Laterality: N/A;   TONSILLECTOMY AND ADENOIDECTOMY      Home Medications:  Allergies as of 01/31/2023   No Known Allergies      Medication List        Accurate as of January 31, 2023  4:08 PM. If you have any questions, ask your nurse or doctor.          CALCIUM + D3 PO Take 2 tablets by mouth in the morning.   diclofenac 50 MG EC tablet Commonly known as: VOLTAREN Take 50 mg by mouth See admin instructions. Take 2 tablets (100 mg) by mouth in the morning & take 1 tablet (50 mg) by mouth at night.   diclofenac 50 MG tablet Commonly known as: CATAFLAM TAKE 1 TABLET BY MOUTH THREE TIMES A DAY AS NEEDED   gabapentin 100 MG capsule Commonly known as: NEURONTIN Take 1 capsule by mouth twice daily   glipiZIDE 5 MG  tablet Commonly known as: GLUCOTROL TAKE 1 TABLET BY MOUTH EVERY DAY   ibuprofen 200 MG tablet Commonly known as: ADVIL Take 400 mg by mouth every 8 (eight) hours as needed (for pain.).   metFORMIN 1000 MG tablet Commonly known as: GLUCOPHAGE TAKE 1 TABLET BY MOUTH TWICE A DAY   metoprolol tartrate 50 MG tablet Commonly known as: LOPRESSOR Take 1 tab night before and 1 tab 90 minutes prior to CCTA   mirabegron ER 25 MG Tb24 tablet Commonly known as: Myrbetriq Take 1 tablet (25 mg total) by mouth daily.   oxybutynin 10 MG 24 hr tablet Commonly known as: DITROPAN-XL TAKE 1 TABLET BY MOUTH EVERY DAY   Ozempic (0.25 or 0.5 MG/DOSE) 2 MG/3ML Sopn Generic drug: Semaglutide(0.25 or 0.5MG /DOS) INJECT 0.5 MG INTO THE SKIN ONE TIME PER WEEK   pioglitazone 15 MG tablet Commonly known as: ACTOS TAKE 1 TABLET BY MOUTH EVERY DAY   rosuvastatin 10 MG tablet Commonly known as: CRESTOR TAKE 1 TABLET BY MOUTH EVERY DAY   sildenafil 100 MG tablet Commonly known as: VIAGRA Take 1 tablet (100 mg total) by mouth daily as needed for erectile dysfunction. Take one tab one hour prior to intercourse   tamsulosin 0.4 MG Caps capsule Commonly known as: FLOMAX TAKE 1 CAPSULE BY MOUTH EVERY DAY   traMADol 50 MG tablet Commonly known as: ULTRAM Take 50 mg by mouth 2 (two) times daily as needed.        Family History: Family History  Problem Relation Age of Onset   Diabetes Father    Diabetes Brother     Social History:  reports that he has never smoked. He has never been exposed to tobacco smoke. He has never used smokeless tobacco. He reports current alcohol use. He reports that he does not use drugs.   Physical Exam: BP 109/66   Pulse 99   Constitutional:  Alert and oriented, No acute distress. HEENT: [place] AT, moist mucus membranes.  Trachea midline, no masses. Neurologic: Grossly intact, no focal deficits, moving all 4 extremities. Psychiatric: Normal mood and  affect.   Assessment & Plan:    1. Prostate cancer, high risk  - We had a lengthy discussion today about duration of Eligard risks and benefits etc. He's agreed to one last Depo today and then no further ADT from here on.  - Continue to monitor his PSA on a Q6 month basis. He is a higher risk  of recurrence based on his initial presentation.  - Reviewed the concept of Nadir and that it will take about a year for the hormones to wear off.   2. Urinary frequency  -  Has some nighttime symptoms. Suggests he get a sleep study based on his neck girth; suspicious that he has possible underlying sleep apnea.   - He's no longer taking any of his OAB or alpha blocker medications fut doesn't know why. He's willing to go back on Flomax and see if it's helpful. Will prescribe this today.   Return in about 6 months (around 08/01/2023) for PSA.  I have reviewed the above documentation for accuracy and completeness, and I agree with the above.   Vanna Scotland, MD    [facility] [address], [address] [place], [place] [address] [phone]

## 2023-01-31 NOTE — Progress Notes (Unsigned)
Eligard SubQ Injection   Due to Prostate Cancer patient is present today for a Eligard Injection.  Medication: Eligard 6 month Dose: 45 mg  Location: right UOQ Lot: [id number] Exp: 01/15/2024  Patient tolerated well, no complications were noted  Performed by: Vela Prose  Per Dr. Apolinar Junes patient received last dose of eligard today. Patient's next follow up was scheduled for 2025. This appointment was scheduled using wheel and given to patient today along with reminder continue on Vitamin D 800-1000iu and Calcium 1000-1200mg  daily while on Androgen Deprivation Therapy.  PA approval dates:

## 2023-02-02 ENCOUNTER — Other Ambulatory Visit: Payer: 59

## 2023-02-02 ENCOUNTER — Ambulatory Visit (INDEPENDENT_AMBULATORY_CARE_PROVIDER_SITE_OTHER): Payer: 59 | Admitting: Cardiovascular Disease

## 2023-02-02 ENCOUNTER — Encounter: Payer: Self-pay | Admitting: Cardiovascular Disease

## 2023-02-02 VITALS — BP 116/74 | HR 69 | Ht 73.0 in | Wt 282.8 lb

## 2023-02-02 DIAGNOSIS — R0602 Shortness of breath: Secondary | ICD-10-CM | POA: Diagnosis not present

## 2023-02-02 DIAGNOSIS — E782 Mixed hyperlipidemia: Secondary | ICD-10-CM

## 2023-02-02 DIAGNOSIS — I10 Essential (primary) hypertension: Secondary | ICD-10-CM

## 2023-02-02 DIAGNOSIS — I340 Nonrheumatic mitral (valve) insufficiency: Secondary | ICD-10-CM

## 2023-02-02 DIAGNOSIS — I25118 Atherosclerotic heart disease of native coronary artery with other forms of angina pectoris: Secondary | ICD-10-CM | POA: Diagnosis not present

## 2023-02-02 MED ORDER — ASPIRIN 81 MG PO TBEC: 81.0000 mg | DELAYED_RELEASE_TABLET | Freq: Every day | ORAL | 12 refills | Status: AC

## 2023-02-02 MED ORDER — ROSUVASTATIN CALCIUM 40 MG PO TABS: 40.0000 mg | ORAL_TABLET | Freq: Every day | ORAL | 2 refills | Status: DC

## 2023-02-02 NOTE — Progress Notes (Signed)
Cardiology Office Note   Date:  02/02/2023   ID:  Raymond Michael, DOB 16-Jan-1956, MRN [id number]  PCP:  Miki Kins, FNP  Cardiologist:  Adrian Blackwater, MD      History of Present Illness: Raymond Michael is a 67 y.o. male who presents for  Chief Complaint  Patient presents with   Follow-up    CCTA results    No complaints      Past Medical History:  Diagnosis Date   Arthritis    Cancer (HCC)    Diabetes (HCC)    GERD (gastroesophageal reflux disease)    Hyperlipidemia      Past Surgical History:  Procedure Laterality Date   COLONOSCOPY     COLONOSCOPY WITH PROPOFOL N/A 08/13/2021   Procedure: COLONOSCOPY WITH PROPOFOL;  Surgeon: Wyline Mood, MD;  Location: [facility] ENDOSCOPY;  Service: Gastroenterology;  Laterality: N/A;   NOSE SURGERY     RADIOACTIVE SEED IMPLANT N/A 02/22/2021   Procedure: RADIOACTIVE SEED IMPLANT/BRACHYTHERAPY IMPLANT;  Surgeon: Vanna Scotland, MD;  Location: [facility] ORS;  Service: Urology;  Laterality: N/A;   TONSILLECTOMY AND ADENOIDECTOMY       Current Outpatient Medications  Medication Sig Dispense Refill   aspirin EC 81 MG tablet Take 1 tablet (81 mg total) by mouth daily. Swallow whole. 30 tablet 12   Calcium Carb-Cholecalciferol (CALCIUM + D3 PO) Take 2 tablets by mouth in the morning.     diclofenac (CATAFLAM) 50 MG tablet TAKE 1 TABLET BY MOUTH THREE TIMES A DAY AS NEEDED 90 tablet 6   diclofenac (VOLTAREN) 50 MG EC tablet Take 50 mg by mouth See admin instructions. Take 2 tablets (100 mg) by mouth in the morning & take 1 tablet (50 mg) by mouth at night.     gabapentin (NEURONTIN) 100 MG capsule Take 1 capsule by mouth twice daily 180 capsule 2   glipiZIDE (GLUCOTROL) 5 MG tablet TAKE 1 TABLET BY MOUTH EVERY DAY 90 tablet 1   ibuprofen (ADVIL) 200 MG tablet Take 400 mg by mouth every 8 (eight) hours as needed (for pain.).     metFORMIN (GLUCOPHAGE) 1000 MG tablet TAKE 1 TABLET BY MOUTH TWICE A DAY 180 tablet 3   metoprolol tartrate  (LOPRESSOR) 50 MG tablet Take 1 tab night before and 1 tab 90 minutes prior to CCTA 2 tablet 0   mirabegron ER (MYRBETRIQ) 25 MG TB24 tablet Take 1 tablet (25 mg total) by mouth daily. 30 tablet 0   oxybutynin (DITROPAN-XL) 10 MG 24 hr tablet TAKE 1 TABLET BY MOUTH EVERY DAY 90 tablet 3   pioglitazone (ACTOS) 15 MG tablet TAKE 1 TABLET BY MOUTH EVERY DAY 90 tablet 1   rosuvastatin (CRESTOR) 40 MG tablet Take 1 tablet (40 mg total) by mouth daily. 30 tablet 2   Semaglutide,0.25 or 0.5MG /DOS, (OZEMPIC, 0.25 OR 0.5 MG/DOSE,) 2 MG/3ML SOPN INJECT 0.5 MG INTO THE SKIN ONE TIME PER WEEK 2 mL 1   sildenafil (VIAGRA) 100 MG tablet Take 1 tablet (100 mg total) by mouth daily as needed for erectile dysfunction. Take one tab one hour prior to intercourse 30 tablet 3   tamsulosin (FLOMAX) 0.4 MG CAPS capsule TAKE 1 CAPSULE BY MOUTH EVERY DAY 90 capsule 3   traMADol (ULTRAM) 50 MG tablet Take 50 mg by mouth 2 (two) times daily as needed.     No current facility-administered medications for this visit.    Allergies:   Patient has no known allergies.  Social History:   reports that he has never smoked. He has never been exposed to tobacco smoke. He has never used smokeless tobacco. He reports current alcohol use. He reports that he does not use drugs.   Family History:  family history includes Diabetes in his brother and father.    ROS:     Review of Systems  Constitutional: Negative.   HENT: Negative.    Eyes: Negative.   Respiratory: Negative.    Gastrointestinal: Negative.   Genitourinary: Negative.   Musculoskeletal: Negative.   Skin: Negative.   Neurological: Negative.   Endo/Heme/Allergies: Negative.   Psychiatric/Behavioral: Negative.    All other systems reviewed and are negative.     All other systems are reviewed and negative.    PHYSICAL EXAM: VS:  BP 116/74   Pulse 69   Ht 6\' 1"  (1.854 m)   Wt 282 lb 12.8 oz (128.3 kg)   SpO2 95%   BMI 37.31 kg/m  , BMI Body mass index  is 37.31 kg/m. Last weight:  Wt Readings from Last 3 Encounters:  02/02/23 282 lb 12.8 oz (128.3 kg)  01/16/23 284 lb (128.8 kg)  12/19/22 268 lb (121.6 kg)     Physical Exam Vitals reviewed.  Constitutional:      Appearance: Normal appearance. He is normal weight.  HENT:     Head: Normocephalic.     Nose: Nose normal.     Mouth/Throat:     Mouth: Mucous membranes are moist.  Eyes:     Pupils: Pupils are equal, round, and reactive to light.  Cardiovascular:     Rate and Rhythm: Normal rate and regular rhythm.     Pulses: Normal pulses.     Heart sounds: Normal heart sounds.  Pulmonary:     Effort: Pulmonary effort is normal.  Abdominal:     General: Abdomen is flat. Bowel sounds are normal.  Musculoskeletal:        General: Normal range of motion.     Cervical back: Normal range of motion.  Skin:    General: Skin is warm.  Neurological:     General: No focal deficit present.     Mental Status: He is alert.  Psychiatric:        Mood and Affect: Mood normal.       EKG:   Recent Labs: 12/05/2022: ALT 33; Hemoglobin 10.6; Platelets 206; TSH 3.290 01/19/2023: BUN 17; Creatinine, Ser 1.39; Potassium 5.0; Sodium 140    Lipid Panel    Component Value Date/Time   CHOL 124 12/05/2022 1309   TRIG 132 12/05/2022 1309   HDL 49 12/05/2022 1309   CHOLHDL 2.5 12/05/2022 1309   LDLCALC 52 12/05/2022 1309      Other studies Reviewed: Additional studies/ records that were reviewed today include:  Review of the above records demonstrates:       No data to display            ASSESSMENT AND PLAN:    ICD-10-CM   1. SOB (shortness of breath)  R06.02 rosuvastatin (CRESTOR) 40 MG tablet    aspirin EC 81 MG tablet    2. Essential hypertension, benign  I10 rosuvastatin (CRESTOR) 40 MG tablet    aspirin EC 81 MG tablet    3. Mixed hyperlipidemia  E78.2 rosuvastatin (CRESTOR) 40 MG tablet    aspirin EC 81 MG tablet   As has diffuse CAD, will change crestor to 40  mg to cause regression of CAD, as high  risk for ebvents and add asp    4. Coronary artery disease of native artery of native heart with stable angina pectoris (HCC)  I25.118 rosuvastatin (CRESTOR) 40 MG tablet    aspirin EC 81 MG tablet   ca score 1324. with mild CAD.  change crestor 40, add asp as high risk for events    5. Nonrheumatic mitral valve regurgitation  I34.0 rosuvastatin (CRESTOR) 40 MG tablet    aspirin EC 81 MG tablet   Trace MR with normal LVEF       Problem List Items Addressed This Visit       Cardiovascular and Mediastinum   Essential hypertension, benign   Relevant Medications   rosuvastatin (CRESTOR) 40 MG tablet   aspirin EC 81 MG tablet     Other   HLD (hyperlipidemia)   Relevant Medications   rosuvastatin (CRESTOR) 40 MG tablet   aspirin EC 81 MG tablet   Other Visit Diagnoses       SOB (shortness of breath)    -  Primary   Relevant Medications   rosuvastatin (CRESTOR) 40 MG tablet   aspirin EC 81 MG tablet     Coronary artery disease of native artery of native heart with stable angina pectoris (HCC)       ca score 1324. with mild CAD.  change crestor 40, add asp as high risk for events   Relevant Medications   rosuvastatin (CRESTOR) 40 MG tablet   aspirin EC 81 MG tablet     Nonrheumatic mitral valve regurgitation       Trace MR with normal LVEF   Relevant Medications   rosuvastatin (CRESTOR) 40 MG tablet   aspirin EC 81 MG tablet          Disposition:   Return in about 3 months (around 05/03/2023).    Total time spent: 30 minutes  Signed,  Adrian Blackwater, MD  02/02/2023 9:56 AM    [facility]

## 2023-02-03 ENCOUNTER — Other Ambulatory Visit: Payer: Self-pay | Admitting: Cardiovascular Disease

## 2023-02-03 DIAGNOSIS — E782 Mixed hyperlipidemia: Secondary | ICD-10-CM

## 2023-02-03 DIAGNOSIS — I25118 Atherosclerotic heart disease of native coronary artery with other forms of angina pectoris: Secondary | ICD-10-CM

## 2023-02-03 DIAGNOSIS — I10 Essential (primary) hypertension: Secondary | ICD-10-CM

## 2023-02-03 DIAGNOSIS — R0602 Shortness of breath: Secondary | ICD-10-CM

## 2023-02-03 DIAGNOSIS — I340 Nonrheumatic mitral (valve) insufficiency: Secondary | ICD-10-CM

## 2023-02-09 ENCOUNTER — Ambulatory Visit
Admission: RE | Admit: 2023-02-09 | Discharge: 2023-02-09 | Disposition: A | Discharge status: Home / Self Care | Payer: 59 | Source: Ambulatory Visit | Attending: Radiation Oncology | Admitting: Radiation Oncology

## 2023-02-09 ENCOUNTER — Encounter: Payer: Self-pay | Admitting: Radiation Oncology

## 2023-02-09 VITALS — BP 105/68 | HR 80 | Resp 20 | Wt 281.0 lb

## 2023-02-09 DIAGNOSIS — C61 Malignant neoplasm of prostate: Secondary | ICD-10-CM | POA: Diagnosis present

## 2023-02-09 DIAGNOSIS — Z923 Personal history of irradiation: Secondary | ICD-10-CM | POA: Diagnosis not present

## 2023-02-09 NOTE — Progress Notes (Signed)
Radiation Oncology Follow up Note  Name: Raymond Michael   Date:   02/09/2023 MRN:  [id number] DOB: 18-Jan-1956    This 67 y.o. male presents to the clinic today fo 3 and half year follow-up status post I-125 interstitial implant as boost the patient also received external beam radiation therapy to his prostate and pelvic nodes for Gleason 7 (4+3) adenocarcinoma presented with a PSA of 93  REFERRING PROVIDER: Miki Kins, FNP  HPI: Patient is a 67 year old male now out over 3-1/2 years having completed both external beam radiation therapy to prostate and pelvic nodes as well as an I-125 interstitial implant for Gleason 7 adenocarcinoma presented with a PSA of 93 seen today in routine follow-up he is doing fairly well specifically denies any increased lower urinary tract symptoms diarrhea or fatigue.  He currently is on Eligard therapy through urology.  His most recent PSA is 0.25.Raymond Michael  COMPLICATIONS OF TREATMENT: none  FOLLOW UP COMPLIANCE: keeps appointments   PHYSICAL EXAM:  BP 105/68   Pulse 80   Resp 20   Wt 281 lb (127.5 kg)   BMI 37.07 kg/m  Well-developed well-nourished patient in NAD. HEENT reveals PERLA, EOMI, discs not visualized.  Oral cavity is clear. No oral mucosal lesions are identified. Neck is clear without evidence of cervical or supraclavicular adenopathy. Lungs are clear to A&P. Cardiac examination is essentially unremarkable with regular rate and rhythm without murmur rub or thrill. Abdomen is benign with no organomegaly or masses noted. Motor sensory and DTR levels are equal and symmetric in the upper and lower extremities. Cranial nerves II through XII are grossly intact. Proprioception is intact. No peripheral adenopathy or edema is identified. No motor or sensory levels are noted. Crude visual fields are within normal range.  RADIOLOGY RESULTS: No current films for review  PLAN: Present time patient is under excellent biochemical control of his prostate cancer.   He continues close follow-up care and treatment with ADT therapy through urology.  I am going to turn follow-up care over to urology.  I be happy to reevaluate the patient anytime should that be indicated.  I would like to take this opportunity to thank you for allowing me to participate in the care of your patient.Raymond Miller, MD

## 2023-02-17 ENCOUNTER — Telehealth: Payer: Self-pay | Admitting: Family

## 2023-02-17 ENCOUNTER — Other Ambulatory Visit: Payer: Self-pay

## 2023-02-17 DIAGNOSIS — R3911 Hesitancy of micturition: Secondary | ICD-10-CM

## 2023-02-17 MED ORDER — TAMSULOSIN HCL 0.4 MG PO CAPS: 0.4000 mg | ORAL_CAPSULE | Freq: Every day | ORAL | 0 refills | Status: DC

## 2023-02-17 NOTE — Telephone Encounter (Signed)
 Pt needs his [facility] sent to [facility] [address]

## 2023-03-16 ENCOUNTER — Other Ambulatory Visit: Payer: Self-pay

## 2023-03-16 MED ORDER — TAMSULOSIN HCL 0.4 MG PO CAPS: 0.4000 mg | ORAL_CAPSULE | Freq: Every day | ORAL | 11 refills | Status: AC

## 2023-03-20 ENCOUNTER — Telehealth: Payer: Self-pay | Admitting: Family

## 2023-03-20 DIAGNOSIS — R3911 Hesitancy of micturition: Secondary | ICD-10-CM

## 2023-03-20 NOTE — Telephone Encounter (Signed)
Patient left VM that he is waiting on an Rx be sent to the pharmacy. Did not state which medication.

## 2023-03-22 MED ORDER — TAMSULOSIN HCL 0.4 MG PO CAPS: 0.4000 mg | ORAL_CAPSULE | Freq: Every day | ORAL | 0 refills | Status: DC

## 2023-05-04 ENCOUNTER — Ambulatory Visit: Payer: 59 | Admitting: Cardiovascular Disease

## 2023-05-30 ENCOUNTER — Telehealth: Payer: Self-pay | Admitting: Family

## 2023-05-30 NOTE — Telephone Encounter (Signed)
 Patient left VM that he has a question about a medication and would like a call back.

## 2023-06-01 ENCOUNTER — Other Ambulatory Visit: Payer: Self-pay

## 2023-06-01 DIAGNOSIS — E1140 Type 2 diabetes mellitus with diabetic neuropathy, unspecified: Secondary | ICD-10-CM

## 2023-06-01 MED ORDER — GABAPENTIN 100 MG PO CAPS: 100.0000 mg | ORAL_CAPSULE | Freq: Two times a day (BID) | ORAL | 2 refills | Status: AC

## 2023-06-09 ENCOUNTER — Other Ambulatory Visit: Payer: Self-pay | Admitting: Family

## 2023-08-01 ENCOUNTER — Other Ambulatory Visit: Payer: Self-pay

## 2023-08-01 ENCOUNTER — Telehealth: Payer: Self-pay | Admitting: Urology

## 2023-08-01 DIAGNOSIS — C61 Malignant neoplasm of prostate: Secondary | ICD-10-CM

## 2023-08-01 NOTE — Telephone Encounter (Signed)
 Patient was seen for lab appointment today. He thought he was coming in for Eligard  shot. He said he thought today was his last one? Please call patient to advise.

## 2023-08-01 NOTE — Telephone Encounter (Signed)
 I spoke with Raymond Michael regarding Raymond Michael's Eligard , and at Raymond Michael's last appointment with Dr. Ace Holder in December, he had agreed to that being his last one, and no further ones. I called Raymond Michael and advised him that his lab results will be reviewed and then a plan can be discussed after his level is known. Raymond Michael verbalized understanding.

## 2023-08-02 LAB — PSA: Prostate Specific Ag, Serum: <0.1 ng/mL (ref 0.0–4.0)

## 2023-08-08 ENCOUNTER — Ambulatory Visit: Payer: 59 | Admitting: Urology

## 2023-08-09 ENCOUNTER — Ambulatory Visit: Payer: Self-pay | Admitting: Urology

## 2023-09-04 ENCOUNTER — Other Ambulatory Visit: Payer: Self-pay | Admitting: Ophthalmology

## 2023-09-04 ENCOUNTER — Ambulatory Visit
Admission: RE | Admit: 2023-09-04 | Discharge: 2023-09-04 | Disposition: A | Discharge status: Home / Self Care | Source: Ambulatory Visit | Attending: Ophthalmology | Admitting: Ophthalmology

## 2023-09-04 ENCOUNTER — Other Ambulatory Visit
Admission: RE | Admit: 2023-09-04 | Discharge: 2023-09-04 | Disposition: A | Discharge status: Home / Self Care | Source: Ambulatory Visit | Attending: Ophthalmology | Admitting: Ophthalmology

## 2023-09-04 DIAGNOSIS — H209 Unspecified iridocyclitis: Secondary | ICD-10-CM | POA: Diagnosis present

## 2023-09-04 LAB — CBC
HCT: 29.2 % — ABNORMAL LOW (ref 39.0–52.0)
Hemoglobin: 9.5 g/dL — ABNORMAL LOW (ref 13.0–17.0)
MCH: 28.7 pg (ref 26.0–34.0)
MCHC: 32.5 g/dL (ref 30.0–36.0)
MCV: 88.2 fL (ref 80.0–100.0)
Platelets: 215 K/uL (ref 150–400)
RBC: 3.31 MIL/uL — ABNORMAL LOW (ref 4.22–5.81)
RDW: 15.2 % (ref 11.5–15.5)
WBC: 3.8 K/uL — ABNORMAL LOW (ref 4.0–10.5)
nRBC: 0 % (ref 0.0–0.2)

## 2023-09-04 LAB — SEDIMENTATION RATE: Sed Rate: 68 mm/h — ABNORMAL HIGH (ref 0–20)

## 2023-09-05 LAB — ANGIOTENSIN CONVERTING ENZYME: Angiotensin-Converting Enzyme: 64 U/L (ref 14–82)

## 2023-09-05 LAB — ANA: Anti Nuclear Antibody (ANA): POSITIVE — AB

## 2023-09-05 LAB — RPR: RPR Ser Ql: NONREACTIVE

## 2023-09-06 ENCOUNTER — Other Ambulatory Visit: Payer: Self-pay | Admitting: Family

## 2023-09-06 DIAGNOSIS — R3911 Hesitancy of micturition: Secondary | ICD-10-CM

## 2023-09-06 LAB — ANCA PROFILE
Anti-MPO Antibodies: <0.2 U (ref 0.0–0.9)
Anti-PR3 Antibodies: <0.2 U (ref 0.0–0.9)
Atypical P-ANCA titer: <1:20 {titer}
C-ANCA: <1:20 {titer}
P-ANCA: <1:20 {titer}

## 2023-09-06 LAB — QUANTIFERON-TB GOLD PLUS (RQFGPL)
QuantiFERON Mitogen Value: >10 [IU]/mL
QuantiFERON Nil Value: 0 [IU]/mL
QuantiFERON TB1 Ag Value: 0.02 [IU]/mL
QuantiFERON TB2 Ag Value: 0.02 [IU]/mL

## 2023-09-06 LAB — QUANTIFERON-TB GOLD PLUS: QuantiFERON-TB Gold Plus: NEGATIVE

## 2023-09-08 LAB — HLA-B27 ANTIGEN: HLA-B27: POSITIVE

## 2023-10-04 ENCOUNTER — Encounter: Payer: Self-pay | Admitting: Urology

## 2023-12-08 ENCOUNTER — Other Ambulatory Visit: Payer: Self-pay | Admitting: Family

## 2023-12-08 DIAGNOSIS — R3911 Hesitancy of micturition: Secondary | ICD-10-CM

## 2024-01-22 ENCOUNTER — Other Ambulatory Visit: Payer: Self-pay

## 2024-01-22 DIAGNOSIS — C61 Malignant neoplasm of prostate: Secondary | ICD-10-CM

## 2024-01-26 ENCOUNTER — Other Ambulatory Visit: Payer: Self-pay

## 2024-01-26 DIAGNOSIS — C61 Malignant neoplasm of prostate: Secondary | ICD-10-CM

## 2024-01-27 LAB — PSA: Prostate Specific Ag, Serum: <0.1 ng/mL (ref 0.0–4.0)

## 2024-01-29 ENCOUNTER — Ambulatory Visit: Admitting: Urology

## 2024-01-30 ENCOUNTER — Ambulatory Visit: Payer: Self-pay | Admitting: Urology

## 2024-01-31 ENCOUNTER — Other Ambulatory Visit: Payer: Self-pay | Admitting: Cardiovascular Disease

## 2024-01-31 DIAGNOSIS — E782 Mixed hyperlipidemia: Secondary | ICD-10-CM

## 2024-01-31 DIAGNOSIS — I10 Essential (primary) hypertension: Secondary | ICD-10-CM

## 2024-01-31 DIAGNOSIS — I25118 Atherosclerotic heart disease of native coronary artery with other forms of angina pectoris: Secondary | ICD-10-CM

## 2024-01-31 DIAGNOSIS — R0602 Shortness of breath: Secondary | ICD-10-CM

## 2024-01-31 DIAGNOSIS — I340 Nonrheumatic mitral (valve) insufficiency: Secondary | ICD-10-CM

## 2024-02-09 ENCOUNTER — Other Ambulatory Visit: Payer: Self-pay | Admitting: Cardiovascular Disease

## 2024-02-09 DIAGNOSIS — R0602 Shortness of breath: Secondary | ICD-10-CM

## 2024-02-09 DIAGNOSIS — I340 Nonrheumatic mitral (valve) insufficiency: Secondary | ICD-10-CM

## 2024-02-09 DIAGNOSIS — I25118 Atherosclerotic heart disease of native coronary artery with other forms of angina pectoris: Secondary | ICD-10-CM

## 2024-02-09 DIAGNOSIS — I10 Essential (primary) hypertension: Secondary | ICD-10-CM

## 2024-02-09 DIAGNOSIS — E782 Mixed hyperlipidemia: Secondary | ICD-10-CM

## 2024-02-09 NOTE — Assessment & Plan Note (Signed)
 High-risk prostate Ca  - PSA 93  - 12-core TRUS bx = GS 4+3, 3+4, 3+3  - PSMA/PET (Oct 2022) - negative  - s/p brachytherapy + ADT (Jan 2023)  - last Eligard  injection 01/31/2023 (patient requested this his last ADT injection, would have covered him to ~2 years)  Last PSA: <0.1 (Dec 2025)   Reviewed his clinical history and recent PSA data, which remains undetectable.  This is very reassuring.  He completed 2 years of ADT for high risk prostate cancer, last injection in December 2024-prior discussion, he does not want to continue any further ADT.  I think this is reasonable and we can continue every 6 month PSA surveillance for now.   - PSA in 6 mo, clinic visit + PSA in 12 mo

## 2024-02-09 NOTE — Progress Notes (Signed)
" ° °  02/16/2024 9:06 AM   Raymond Michael Raymond Michael, Raymond Michael [id number]  Reason for visit: Follow up prostate Ca   HPI: 68 y.o. male, initial follow up with me today, previously seen by Dr. Penne in Dec 2024 First time meeting today Pleasant patient No interval GU issues PSA undetectable from December 2025 He did inquire about erectile dysfunction-currently prescribed 100 mg sildenafil  although he has not tried  Prior HPI: Hx of high-risk prostate Ca  - PSA 93  - 12-core TRUS bx = GS 4+3, 3+4, 3+3  - PSMA/PET (Oct 2022) - negative  - s/p brachytherapy + ADT (Jan 2023)  - last Eligard  injection 01/31/2023 (patient requested this his last ADT injection, would have covered him to ~2 years)    Physical Exam: BP 105/67   Pulse 80   Ht 6' 1 (1.854 m)   Wt 240 lb (108.9 kg)   BMI 31.66 kg/m    Constitutional:  Alert and oriented, No acute distress. GU:    Laboratory Data: Component Ref Range & Units (hover) 2 wk ago 6 mo ago 1 yr ago  Prostate Specific Ag, Serum <0.1 <0.1 CM <0.1 C    Pertinent Imaging: N/A    Assessment & Plan:    Prostate cancer [facility]) Assessment & Plan: High-risk prostate Ca  - PSA 93  - 12-core TRUS bx = GS 4+3, 3+4, 3+3  - PSMA/PET (Oct 2022) - negative  - s/p brachytherapy + ADT (Jan 2023)  - last Eligard  injection 01/31/2023 (patient requested this his last ADT injection, would have covered him to ~2 years)  Last PSA: <0.1 (Dec 2025)   Reviewed his clinical history and recent PSA data, which remains undetectable.  This is very reassuring.  He completed 2 years of ADT for high risk prostate cancer, last injection in December 2024-prior discussion, he does not want to continue any further ADT.  I think this is reasonable and we can continue every 6 month PSA surveillance for now.   - PSA in 6 mo, clinic visit + PSA in 12 mo   Other post-procedural erectile dysfunction Assessment & Plan: History of ED  -Prescribe sildenafil  100 mg, although not  currently taking  -He does not have an active sexual partner  -Completed Eligard  ADT in December 2024  He broach this topic today and I reviewed his clinical history.  Difficult to comment on status of current erectile function as he is not sexually active and also not taking sildenafil .  He may have ED secondary to residual effects of hormone therapy, organic causes, versus early radiation side effects.  I would encourage him to try max dose sildenafil  when convenient and let me know.  We certainly have additional options to explore in the future if needed        Penne JONELLE Skye, MD  [facility] [address] [place], [place] [address] [phone] "

## 2024-02-16 ENCOUNTER — Ambulatory Visit (INDEPENDENT_AMBULATORY_CARE_PROVIDER_SITE_OTHER): Admitting: Urology

## 2024-02-16 ENCOUNTER — Encounter: Payer: Self-pay | Admitting: Urology

## 2024-02-16 ENCOUNTER — Other Ambulatory Visit: Payer: Self-pay

## 2024-02-16 VITALS — BP 105/67 | HR 80 | Ht 73.0 in | Wt 240.0 lb

## 2024-02-16 DIAGNOSIS — I25118 Atherosclerotic heart disease of native coronary artery with other forms of angina pectoris: Secondary | ICD-10-CM

## 2024-02-16 DIAGNOSIS — C61 Malignant neoplasm of prostate: Secondary | ICD-10-CM

## 2024-02-16 DIAGNOSIS — N5239 Other post-surgical erectile dysfunction: Secondary | ICD-10-CM

## 2024-02-16 DIAGNOSIS — R0602 Shortness of breath: Secondary | ICD-10-CM

## 2024-02-16 DIAGNOSIS — E782 Mixed hyperlipidemia: Secondary | ICD-10-CM

## 2024-02-16 DIAGNOSIS — I340 Nonrheumatic mitral (valve) insufficiency: Secondary | ICD-10-CM

## 2024-02-16 DIAGNOSIS — I10 Essential (primary) hypertension: Secondary | ICD-10-CM

## 2024-02-16 NOTE — Assessment & Plan Note (Signed)
 History of ED  -Prescribe sildenafil  100 mg, although not currently taking  -He does not have an active sexual partner  -Completed Eligard  ADT in December 2024  He broach this topic today and I reviewed his clinical history.  Difficult to comment on status of current erectile function as he is not sexually active and also not taking sildenafil .  He may have ED secondary to residual effects of hormone therapy, organic causes, versus early radiation side effects.  I would encourage him to try max dose sildenafil  when convenient and let me know.  We certainly have additional options to explore in the future if needed

## 2024-02-16 NOTE — Addendum Note (Signed)
 Addended byBETHA CORIE PLATER on: 02/16/2024 09:10 AM   Modules accepted: Orders

## 2024-02-19 ENCOUNTER — Other Ambulatory Visit: Payer: Self-pay | Admitting: Cardiovascular Disease

## 2024-02-19 DIAGNOSIS — I25118 Atherosclerotic heart disease of native coronary artery with other forms of angina pectoris: Secondary | ICD-10-CM

## 2024-02-19 DIAGNOSIS — I340 Nonrheumatic mitral (valve) insufficiency: Secondary | ICD-10-CM

## 2024-02-19 DIAGNOSIS — R0602 Shortness of breath: Secondary | ICD-10-CM

## 2024-02-19 DIAGNOSIS — E782 Mixed hyperlipidemia: Secondary | ICD-10-CM

## 2024-02-19 DIAGNOSIS — I10 Essential (primary) hypertension: Secondary | ICD-10-CM

## 2024-03-05 ENCOUNTER — Telehealth: Payer: Self-pay | Admitting: Cardiovascular Disease

## 2024-03-05 NOTE — Telephone Encounter (Signed)
 Patient called and stated he has requested a refill several times on the Aspirin  81 mg tablets. He was informed he would need a follow up for refills as it has been over a year since he has been seen. He declined to make an appointment.

## 2024-03-14 ENCOUNTER — Other Ambulatory Visit: Payer: Self-pay | Admitting: Family

## 2024-03-14 DIAGNOSIS — R3911 Hesitancy of micturition: Secondary | ICD-10-CM

## 2024-08-15 ENCOUNTER — Other Ambulatory Visit

## 2024-08-23 ENCOUNTER — Ambulatory Visit: Admitting: Urology
